# Patient Record
Sex: Male | Born: 1970 | Race: Black or African American | Hispanic: No | Marital: Married | Smoking: Former smoker
Health system: Southern US, Community
[De-identification: ages and names within clinical notes are randomized; demographics above are authoritative.]

## PROBLEM LIST (undated history)

## (undated) DIAGNOSIS — C6290 Malignant neoplasm of unspecified testis, unspecified whether descended or undescended: Secondary | ICD-10-CM

## (undated) DIAGNOSIS — F1910 Other psychoactive substance abuse, uncomplicated: Secondary | ICD-10-CM

## (undated) DIAGNOSIS — I82401 Acute embolism and thrombosis of unspecified deep veins of right lower extremity: Secondary | ICD-10-CM

## (undated) DIAGNOSIS — F419 Anxiety disorder, unspecified: Secondary | ICD-10-CM

## (undated) DIAGNOSIS — T7840XA Allergy, unspecified, initial encounter: Secondary | ICD-10-CM

## (undated) HISTORY — DX: Other psychoactive substance abuse, uncomplicated: F19.10

## (undated) HISTORY — PX: ORCHIECTOMY: SHX2116

## (undated) HISTORY — DX: Anxiety disorder, unspecified: F41.9

## (undated) HISTORY — DX: Malignant neoplasm of unspecified testis, unspecified whether descended or undescended: C62.90

## (undated) HISTORY — DX: Acute embolism and thrombosis of unspecified deep veins of right lower extremity: I82.401

## (undated) HISTORY — DX: Allergy, unspecified, initial encounter: T78.40XA

## (undated) HISTORY — PX: TESTICLE REMOVAL: SHX68

## (undated) HISTORY — PX: COLONOSCOPY: SHX174

## (undated) HISTORY — PX: OTHER SURGICAL HISTORY: SHX169

---

## 1998-03-09 ENCOUNTER — Encounter: Admission: RE | Admit: 1998-03-09 | Discharge: 1998-03-09 | Payer: Self-pay | Admitting: *Deleted

## 1999-06-18 ENCOUNTER — Encounter: Payer: Self-pay | Admitting: Emergency Medicine

## 1999-06-18 ENCOUNTER — Emergency Department (HOSPITAL_COMMUNITY): Admission: EM | Admit: 1999-06-18 | Discharge: 1999-06-18 | Payer: Self-pay | Admitting: Emergency Medicine

## 2002-10-25 ENCOUNTER — Encounter: Payer: Self-pay | Admitting: Emergency Medicine

## 2002-10-25 ENCOUNTER — Emergency Department (HOSPITAL_COMMUNITY): Admission: EM | Admit: 2002-10-25 | Discharge: 2002-10-25 | Payer: Self-pay | Admitting: Emergency Medicine

## 2010-01-09 ENCOUNTER — Encounter (INDEPENDENT_AMBULATORY_CARE_PROVIDER_SITE_OTHER): Payer: Self-pay | Admitting: *Deleted

## 2010-01-12 ENCOUNTER — Ambulatory Visit: Payer: Self-pay | Admitting: Gastroenterology

## 2010-01-12 DIAGNOSIS — K625 Hemorrhage of anus and rectum: Secondary | ICD-10-CM | POA: Insufficient documentation

## 2010-01-12 DIAGNOSIS — F1210 Cannabis abuse, uncomplicated: Secondary | ICD-10-CM | POA: Insufficient documentation

## 2010-01-12 DIAGNOSIS — F1920 Other psychoactive substance dependence, uncomplicated: Secondary | ICD-10-CM

## 2010-01-12 DIAGNOSIS — K589 Irritable bowel syndrome without diarrhea: Secondary | ICD-10-CM | POA: Insufficient documentation

## 2010-01-12 DIAGNOSIS — R1012 Left upper quadrant pain: Secondary | ICD-10-CM | POA: Insufficient documentation

## 2010-01-12 LAB — CONVERTED CEMR LAB
ALT: 36 units/L (ref 0–53)
AST: 29 units/L (ref 0–37)
Albumin: 4.3 g/dL (ref 3.5–5.2)
Alkaline Phosphatase: 56 units/L (ref 39–117)
Amylase: 66 units/L (ref 27–131)
BUN: 14 mg/dL (ref 6–23)
Basophils Absolute: 0 10*3/uL (ref 0.0–0.1)
Basophils Relative: 0.4 % (ref 0.0–3.0)
Bilirubin, Direct: 0.1 mg/dL (ref 0.0–0.3)
CO2: 28 meq/L (ref 19–32)
Calcium: 10 mg/dL (ref 8.4–10.5)
Chloride: 103 meq/L (ref 96–112)
Creatinine, Ser: 1.3 mg/dL (ref 0.4–1.5)
Eosinophils Absolute: 0.1 10*3/uL (ref 0.0–0.7)
Eosinophils Relative: 1.7 % (ref 0.0–5.0)
Ferritin: 242.3 ng/mL (ref 22.0–322.0)
Folate: 7.7 ng/mL
GFR calc non Af Amer: 81.16 mL/min (ref >60)
Glucose, Bld: 82 mg/dL (ref 70–99)
HCT: 47 % (ref 39.0–52.0)
Hemoglobin: 16.1 g/dL (ref 13.0–17.0)
Iron: 99 ug/dL (ref 42–165)
Lipase: 23 units/L (ref 11.0–59.0)
Lymphocytes Relative: 20.5 % (ref 12.0–46.0)
Lymphs Abs: 1.4 10*3/uL (ref 0.7–4.0)
MCHC: 34.3 g/dL (ref 30.0–36.0)
MCV: 85.7 fL (ref 78.0–100.0)
Monocytes Absolute: 1.3 10*3/uL — ABNORMAL HIGH (ref 0.1–1.0)
Monocytes Relative: 18.9 % — ABNORMAL HIGH (ref 3.0–12.0)
Neutro Abs: 4 10*3/uL (ref 1.4–7.7)
Neutrophils Relative %: 58.5 % (ref 43.0–77.0)
Platelets: 233 10*3/uL (ref 150.0–400.0)
Potassium: 3.9 meq/L (ref 3.5–5.1)
RBC: 5.48 M/uL (ref 4.22–5.81)
RDW: 13.2 % (ref 11.5–14.6)
Saturation Ratios: 28.4 % (ref 20.0–50.0)
Sodium: 138 meq/L (ref 135–145)
TSH: 2.25 microintl units/mL (ref 0.35–5.50)
Total Bilirubin: 0.8 mg/dL (ref 0.3–1.2)
Total Protein: 8.1 g/dL (ref 6.0–8.3)
Transferrin: 248.9 mg/dL (ref 212.0–360.0)
Vitamin B-12: 441 pg/mL (ref 211–911)
WBC: 6.8 10*3/uL (ref 4.5–10.5)

## 2010-01-16 ENCOUNTER — Ambulatory Visit (HOSPITAL_COMMUNITY): Admission: RE | Admit: 2010-01-16 | Discharge: 2010-01-16 | Payer: Self-pay | Admitting: Gastroenterology

## 2010-01-17 ENCOUNTER — Ambulatory Visit: Payer: Self-pay | Admitting: Gastroenterology

## 2010-01-17 LAB — CONVERTED CEMR LAB: UREASE: NEGATIVE

## 2010-01-18 ENCOUNTER — Telehealth: Payer: Self-pay | Admitting: Gastroenterology

## 2010-01-18 ENCOUNTER — Encounter: Payer: Self-pay | Admitting: Gastroenterology

## 2010-01-19 ENCOUNTER — Encounter: Payer: Self-pay | Admitting: Gastroenterology

## 2010-01-22 ENCOUNTER — Telehealth: Payer: Self-pay | Admitting: Gastroenterology

## 2010-01-24 ENCOUNTER — Telehealth: Payer: Self-pay | Admitting: Gastroenterology

## 2010-02-02 ENCOUNTER — Ambulatory Visit: Payer: Self-pay | Admitting: Gastroenterology

## 2010-02-02 ENCOUNTER — Encounter (INDEPENDENT_AMBULATORY_CARE_PROVIDER_SITE_OTHER): Payer: Self-pay | Admitting: *Deleted

## 2010-02-02 DIAGNOSIS — M109 Gout, unspecified: Secondary | ICD-10-CM | POA: Insufficient documentation

## 2010-02-02 LAB — CONVERTED CEMR LAB: Sed Rate: 12 mm/hr (ref 0–22)

## 2010-02-10 ENCOUNTER — Emergency Department (HOSPITAL_COMMUNITY): Admission: EM | Admit: 2010-02-10 | Discharge: 2010-02-10 | Payer: Self-pay | Admitting: Emergency Medicine

## 2010-03-01 ENCOUNTER — Telehealth: Payer: Self-pay | Admitting: Gastroenterology

## 2010-03-02 ENCOUNTER — Ambulatory Visit: Payer: Self-pay | Admitting: Gastroenterology

## 2010-03-06 ENCOUNTER — Encounter: Payer: Self-pay | Admitting: Gastroenterology

## 2010-03-09 ENCOUNTER — Encounter: Payer: Self-pay | Admitting: Gastroenterology

## 2010-03-09 ENCOUNTER — Inpatient Hospital Stay (HOSPITAL_COMMUNITY): Admission: EM | Admit: 2010-03-09 | Discharge: 2010-03-17 | Payer: Self-pay | Admitting: Cardiovascular Disease

## 2010-03-13 ENCOUNTER — Ambulatory Visit: Payer: Self-pay | Admitting: Vascular Surgery

## 2010-03-13 ENCOUNTER — Ambulatory Visit: Payer: Self-pay | Admitting: Oncology

## 2010-03-16 ENCOUNTER — Ambulatory Visit: Payer: Self-pay | Admitting: Oncology

## 2010-04-05 DIAGNOSIS — K5289 Other specified noninfective gastroenteritis and colitis: Secondary | ICD-10-CM | POA: Insufficient documentation

## 2010-04-05 DIAGNOSIS — K449 Diaphragmatic hernia without obstruction or gangrene: Secondary | ICD-10-CM | POA: Insufficient documentation

## 2010-04-06 ENCOUNTER — Ambulatory Visit: Payer: Self-pay | Admitting: Gastroenterology

## 2010-04-19 ENCOUNTER — Ambulatory Visit: Payer: Self-pay | Admitting: Oncology

## 2010-04-24 ENCOUNTER — Encounter: Payer: Self-pay | Admitting: Gastroenterology

## 2010-04-25 LAB — LUPUS ANTICOAGULANT PANEL
DRVVT 1:1 Mix: 43.4 secs (ref 36.2–44.3)
DRVVT: 76.7 secs — ABNORMAL HIGH (ref 36.2–44.3)
Lupus Anticoagulant: NOT DETECTED
PTT Lupus Anticoagulant: 45.9 secs — ABNORMAL HIGH (ref 30.0–45.6)

## 2010-05-14 ENCOUNTER — Ambulatory Visit (HOSPITAL_COMMUNITY): Admission: RE | Admit: 2010-05-14 | Discharge: 2010-05-14 | Payer: Self-pay | Admitting: Urology

## 2010-05-22 ENCOUNTER — Ambulatory Visit: Payer: Self-pay | Admitting: Oncology

## 2010-05-24 ENCOUNTER — Encounter: Payer: Self-pay | Admitting: Gastroenterology

## 2010-06-04 ENCOUNTER — Ambulatory Visit
Admission: RE | Admit: 2010-06-04 | Discharge: 2010-06-12 | Payer: Self-pay | Source: Home / Self Care | Admitting: Radiation Oncology

## 2010-06-12 ENCOUNTER — Encounter: Payer: Self-pay | Admitting: Gastroenterology

## 2010-06-26 ENCOUNTER — Encounter: Payer: Self-pay | Admitting: Gastroenterology

## 2010-06-27 ENCOUNTER — Ambulatory Visit: Payer: Self-pay | Admitting: Oncology

## 2010-08-16 NOTE — Assessment & Plan Note (Signed)
Summary: 2 Wk follow up/ dfs   History of Present Illness Visit Type: Follow-up Visit Primary GI MD: Sheryn Bison MD FACP FAGA Primary Provider: Geraldo Pitter, MD  Requesting Provider: na Chief Complaint: Spasms and blood have decreased History of Present Illness:   His GI symptoms are much improved but he continued with some cramping, diarrhea, and intermittent rectal bleeding. He has had sudden swelling of his right ankle and right foot and was in urgent care and diagnosed as having gout and placed on colchicine 0.2 mg twice a day with little improvement. His upper gastrointestinal symptoms are markedly better on Carafate, PPI therapy, and anti-spasmodic.  He's had diffuse swelling and pain in his right ankle and foot in the last 48 hours. He denies other arthritis or musculoskeletal problems. Family history is remarkable for gouty arthritis. Patient is a heavy abuser of alcohol but allegedly has not used since his last endoscopic exam several weeks ago. This is confirmed by his girlfriend. He previously had colonoscopy by Dr. 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 pylori. Ultrasound was negative and multiple labs were normal including liver enzymes. CDT level was elevated at 1.6 consistent with heavy alcohol intake.   GI Review of Systems    Reports abdominal pain.      Denies acid reflux, belching, bloating, chest pain, dysphagia with liquids, dysphagia with solids, heartburn, loss of appetite, nausea, vomiting, vomiting blood, weight loss, and  weight gain.      Reports diarrhea, hemorrhoids, and  rectal bleeding.     Denies anal fissure, black tarry stools, change in bowel habit, constipation, diverticulosis, fecal incontinence, heme positive stool, irritable bowel syndrome, jaundice, light color stool, liver problems, and  rectal pain.    Current Medications (verified): 1)   Hydrocodone-Acetaminophen 7.5-750 Mg Tabs (Hydrocodone-Acetaminophen) .... 1/2 Tablet By Mouth As Needed For Pain 2)  [facility]vs Acid Reducer Max St 150 Mg Tabs (Ranitidine Hcl) .... As Needed 3)  Eq Chlortabs 4 Mg Tabs (Chlorpheniramine Maleate) .... As Needed 4)  Carafate 1 Gm Tabs (Sucralfate) .... Take 1 Tablet By Mouth Four Times A Day 1 Hour Before or After Food 5)  Dicyclomine Hcl 10 Mg Caps (Dicyclomine Hcl) .Marland Kitchen.. 1 By Mouth Q 6 Hrs As Needed Spasms 6)  Dexilant 60 Mg Cpdr (Dexlansoprazole) .Marland Kitchen.. 1 Once Daily  Allergies (verified): No Known Drug Allergies  Past History:  Past medical, surgical, family and social histories (including risk factors) reviewed for relevance to current acute and chronic problems.  Past Medical History: Reviewed history from 01/12/2010 and no changes required. Anxiety Disorder  Past Surgical History: Reviewed history from 01/12/2010 and no changes required. Unremarkable  Family History: Reviewed history from 01/12/2010 and no changes required. No FH of Colon Cancer: Family History of Breast Cancer:Mother  Family History of Prostate Cancer:Father   Social History: Reviewed history from 01/12/2010 and no changes required. Occupation: [employer] Married Childern  Alcohol Use - yes: 1-2 daily  Patient is a former smoker: Quit in Feb 2011 Illicit Drug Use - yes: Marijuana Daily Caffeine Use: 4 daily   Review of Systems       The patient complains of arthritis/joint pain and swelling of feet/legs.  The patient denies allergy/sinus, anemia, anxiety-new, back pain, blood in urine, breast changes/lumps, change in vision, confusion, cough, coughing up blood, depression-new, fainting, fatigue, fever, headaches-new, hearing problems, heart murmur, heart rhythm changes, itching, menstrual pain, muscle pains/cramps, night  sweats, nosebleeds, pregnancy symptoms, shortness of breath, skin rash, sleeping problems, sore throat, swollen lymph glands, thirst - excessive ,  urination - excessive , urination changes/pain, urine leakage, vision changes, and voice change.    Vital Signs:  Patient profile:   40 year old male Height:      69 inches Weight:      158.38 pounds BMI:     23.47 Pulse rate:   60 / minute Pulse rhythm:   regular BP sitting:   120 / 68  (left arm) Cuff size:   regular  Vitals Entered By: June McMurray CMA Duncan Dull) (February 02, 2010 11:49 AM)  Physical Exam  General:  Well developed, well nourished, no acute distress.healthy appearing.   Head:  Normocephalic and atraumatic. Eyes:  PERRLA, no icterus.exam deferred to patient's ophthalmologist.   Msk:  joint tenderness, joint redness, joint warmth, and arthritic changes.  right foot and ankle swollen and tender without obvious external lesions. Other joints are unremarkable. Psych:  Alert and cooperative. Normal mood and affect.   Impression & Recommendations:  Problem # 1:  IBS (ICD-564.1) Assessment Improved I am concerned about his continued diarrhea and intermittent rectal bleeding and his recent arthritis. I have referred him to orthopedics for examination, we'll order serum uric acid level, sedimentation rate, and plan colonoscopy. He is to continue to avoid alcohol and to use daily PPIs, dicyclomine and p.r.n. Carafate for his gastritis.  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  Other Orders: TLB-Uric Acid, Blood (84550-URIC) TLB-Sedimentation Rate (ESR) (85652-ESR)  Patient Instructions: 1)  Please go to the basement for lab work. 2)  You are scheduled for a colonoscopy. 3)  You are referred to the orthepedist. 4)  The medication list was reviewed and reconciled.  All changed / newly prescribed medications were explained.  A complete medication list was provided to the patient / caregiver. 5)  Copy sent to :  Dr.Veita Bland 6)  Please continue current medications.  7)  Colonoscopy and Flexible Sigmoidoscopy brochure given.  8)  Conscious Sedation brochure given.  9)  Continued strict avoidance of illicit drugs and alcohol. 10)  Advised to stick with a low residue diet  avoiding food that can irritate bowel (see handout).   Appended Document: 2 Wk follow up/ dfs    Clinical Lists Changes  Medications: Added new medication of MOVIPREP 100 GM  SOLR (PEG-KCL-NACL-NASULF-NA ASC-C) As per prep instructions. - Signed Rx of MOVIPREP 100 GM  SOLR (PEG-KCL-NACL-NASULF-NA ASC-C) As per prep instructions.;  #1 x 0;  Signed;  Entered by: Ashok Cordia RN;  Authorized by: Mardella Layman MD [facility];  Method used: [facility] [address] [phone]*, [address], [place], [place], [place]  [address], Ph: [phone] or [phone], Fax: [phone] Orders: Added new Test order of Colonoscopy (Colon) - Signed Added new Referral order of Orthopedic Referral (Ortho) - Signed    Prescriptions: MOVIPREP 100 GM  SOLR (PEG-KCL-NACL-NASULF-NA ASC-C) As per prep instructions.  #1 x 0   Entered by:   Ashok Cordia RN   Authorized by:   Mardella Layman MD [facility]   Signed by:   Ashok Cordia RN on 02/02/2010   Method used:   [facility]        [facility]  [address] [phone]* (retail)       [address]  [address]       [place], [place]  [address]       Ph: [phone] or [phone]       Fax: [phone]   RxID:   [id number]

## 2010-08-16 NOTE — Letter (Signed)
Summary: [facility]  [facility]   Imported By: Sherian Rein 06/18/2010 10:17:50  _____________________________________________________________________  External Attachment:    Type:   Image     Comment:   External Document  Appended Document: [facility] I HAVE OK'ED RADIATION AND TALKED WITH DR. Dayton Scrape...

## 2010-08-16 NOTE — Letter (Signed)
Summary: [facility]  [facility]   Imported By: Sherian Rein 05/14/2010 09:21:52  _____________________________________________________________________  External Attachment:    Type:   Image     Comment:   External Document

## 2010-08-16 NOTE — Assessment & Plan Note (Signed)
Summary: One month follow up   History of Present Illness Visit Type: follow up Primary GI MD: Sheryn Bison MD FACP FAGA Primary Truda Staub: Geraldo Pitter, MD  Requesting Tita Terhaar: na Chief Complaint: one month follow up of rectal bleeding. Patient states that he is doing much better. History of Present Illness:   this patient is a 40 year old African American male who has chronic recurrent ulcerative colitis. He recent had some rectal bleeding and diarrhea, underwent colonoscopy with mucosal biopsies confirming mild inflammatory bowel disease. 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 As a Result, he has not started aminosalicylate therapy. However, he denies any current GI issues. He also has an element of irritable bowel syndrome.   GI Review of Systems      Denies abdominal pain, acid reflux, belching, bloating, chest pain, dysphagia with liquids, dysphagia with solids, heartburn, loss of appetite, nausea, vomiting, vomiting blood, weight loss, and  weight gain.        Denies anal fissure, black tarry stools, change in bowel habit, constipation, diarrhea, diverticulosis, fecal incontinence, heme positive stool, hemorrhoids, irritable bowel syndrome, jaundice, light color stool, liver problems, rectal bleeding, and  rectal pain.    Current Medications (verified): 1)  Hydrocodone-Acetaminophen 7.5-750 Mg Tabs (Hydrocodone-Acetaminophen) .... 1/2 Tablet By Mouth As Needed For Pain 2)  [facility] Acid Reducer Max St 150 Mg Tabs (Ranitidine Hcl) .... As Needed 3)  Eq Chlortabs 4 Mg Tabs (Chlorpheniramine Maleate) .... As Needed 4)  Coumadin 5 Mg Tabs (Warfarin Sodium) .... One Tablet By Mouth Once Daily 5)  Hydrocodone-Acetaminophen 5-325 Mg Tabs (Hydrocodone-Acetaminophen) .... One Tablet By Mouth As Needed  Allergies (verified): No Known Drug Allergies  Past History:  Past  medical, surgical, family and social histories (including risk factors) reviewed for relevance to current acute and chronic problems.  Past Medical History: Reviewed history from 04/06/2010 and no changes required. 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 Unremarkable  Family History: Reviewed history from 01/12/2010 and no changes required. No FH of Colon Cancer: Family History of Breast Cancer:Mother  Family History of Prostate Cancer:Father   Social History: Reviewed history from 01/12/2010 and no changes required. Occupation: [employer] Married Childern  Alcohol Use - yes: 1-2 daily  Patient is a former smoker: Quit in Feb 2011 Illicit Drug Use - yes: Marijuana Daily Caffeine Use: 4 daily   Review of Systems  The patient denies allergy/sinus, anemia, anxiety-new, arthritis/joint pain, back pain, blood in urine, breast changes/lumps, change in vision, confusion, cough, coughing up blood, depression-new, fainting, fatigue, fever, headaches-new, hearing problems, heart murmur, heart rhythm changes, itching, menstrual pain, muscle pains/cramps, night sweats, nosebleeds, pregnancy symptoms, shortness of breath, skin rash, sleeping problems, sore throat, swelling of feet/legs, swollen lymph glands, thirst - excessive , urination - excessive , urination changes/pain, urine leakage, vision changes, and voice change.    Vital Signs:  Patient profile:   40 year old male Height:      69 inches Weight:      170.13 pounds BMI:     25.21 Pulse rate:   78 / minute Pulse rhythm:   regular BP sitting:   124 / 74  (left arm) Cuff size:  regular  Vitals Entered By: Harlow Mares CMA Duncan Dull) (April 06, 2010 11:27 AM)  Physical Exam  General:  Well developed, well nourished, no acute distress.healthy appearing.   Head:  Normocephalic and atraumatic. Eyes:  PERRLA, no icterus.exam deferred to patient's ophthalmologist.   Psych:  Alert and cooperative. Normal mood and affect.   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 He is to continue other medications listed and reviewed in his record today. He has primary care followup with Dr. Parke Simmers.  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 over-the-counter H2 blockers.  Problem # 3:  IBS (ICD-564.1) Assessment: Improved p.r.n. anti-spasmodic as needed.  Problem # 4:  RECTAL BLEEDING (ICD-569.3) Assessment: Improved  Patient Instructions: 1)  Copy sent to : Geraldo Pitter, MD  2)  Your prescription has been sent to you pharmacy.  3)  Lialda samples given today.  4)  Please schedule a follow-up appointment in 3 months. 5)  The medication list was reviewed and reconciled.  All changed / newly prescribed medications were explained.  A complete medication list was provided to the patient / caregiver. 6)  IBS brochure given.  7)  Inflammatory Bowel Disease brochure given.  Prescriptions: LIALDA 1.2 GM TBEC (MESALAMINE) take two by mouth once daily  #60 x 1   Entered by:   Harlow Mares CMA (AAMA)   Authorized by:   Mardella Layman MD [facility]   Signed by:   Harlow Mares CMA (AAMA) on 04/06/2010   Method used:   Electronically to        [facility] [address] (retail)       [address]       [place], [place]  [address]       Ph: [phone]       Fax: [phone]   RxID:   [id number]   Appended Document: One month follow up copy Dr. Mancel Bale in oncology  Appended Document: One month  follow up biscomed

## 2010-08-16 NOTE — Progress Notes (Signed)
Summary: spasms  Phone Note Call from Patient Call back at Home Phone [phone]   Caller: wife Call For: Dr. Jarold Motto Reason for Call: Talk to Nurse Summary of Call: having stomach spasms since EGD last week Initial call taken by: Vallarie Mare,  January 24, 2010 12:56 PM  Follow-up for Phone Call        Talked with pt.  States he is having crampy pain, spasms in abd.  Worse before he has a BM.  Other symptoms are better.  Asking if there is something he can take for spasms? Follow-up by: Ashok Cordia RN,  January 24, 2010 2:06 PM    Additional Follow-up for Phone Call Additional follow up Details #2::    BENTYL 10 MG Q6H as needed... Follow-up by: Mardella Layman MD Clementeen Graham,  January 24, 2010 2:42 PM   Appended Document: spasms Pt's wife notified.  Req a few more samples of dexialnt.   Clinical Lists Changes  Medications: Added new medication of DICYCLOMINE HCL 10 MG CAPS (DICYCLOMINE HCL) 1 by mouth q 6 hrs as needed spasms - Signed Added new medication of DEXILANT 60 MG CPDR (DEXLANSOPRAZOLE) 1 once daily Rx of DICYCLOMINE HCL 10 MG CAPS (DICYCLOMINE HCL) 1 by mouth q 6 hrs as needed spasms;  #50 x 3;  Signed;  Entered by: Ashok Cordia RN;  Authorized by: Mardella Layman MD [facility];  Method used: [facility] [address] [phone]*, [address], [place], [place], [place]  [id number], Ph: [phone] or [phone], Fax: [phone]    Prescriptions: DICYCLOMINE HCL 10 MG CAPS (DICYCLOMINE HCL) 1 by mouth q 6 hrs as needed spasms  #50 x 3   Entered by:   Ashok Cordia RN   Authorized by:   Mardella Layman MD [facility]   Signed by:   Ashok Cordia RN on 01/24/2010   Method used:   Electronically to        [facility]  [address] [phone]* (retail)       [address]       [place], [place]  [id number]       Ph: [phone] or [phone]       Fax: [phone]   RxID:   [id number]

## 2010-08-16 NOTE — Letter (Signed)
Summary: EGD Instructions  [facility]  [address] [place], [place] [address]   Phone: [phone]  Fax: [phone]       Dustin Chan    06-04-1971    MRN: [id number]       Procedure Day Dorna Bloom: Wednesday July 6th, 2011     Arrival Time: 2:30pm     Procedure Time:3:30pm     Location of Procedure:                    _  x [facility] (4th Floor)    PREPARATION FOR ENDOSCOPY   On 01/17/10 DAY OF THE PROCEDURE:  1.   No solid foods, milk or milk products are allowed after midnight the night before your procedure.  2.   Do not drink anything colored red or purple.  Avoid juices with pulp.  No orange juice.  3.  You may drink clear liquids until1:30pm, which is 2 hours before your procedure.                                                                                                CLEAR LIQUIDS INCLUDE: Water Jello Ice Popsicles Tea (sugar ok, no milk/cream) Powdered fruit flavored drinks Coffee (sugar ok, no milk/cream) Gatorade Juice: apple, white grape, white cranberry  Lemonade Clear bullion, consomm, broth Carbonated beverages (any kind) Strained chicken noodle soup Hard Candy   MEDICATION INSTRUCTIONS  Unless otherwise instructed, you should take regular prescription medications with a small sip of water as early as possible the morning of your procedure.         OTHER INSTRUCTIONS  You will need a responsible adult at least 38 years of age to accompany you and drive you home.   This person must remain in the waiting room during your procedure.  Wear loose fitting clothing that is easily removed.  Leave jewelry and other valuables at home.  However, you may wish to bring a book to read or an iPod/MP3 player to listen to music as you wait for your procedure to start.  Remove all body piercing jewelry and leave at home.  Total time from sign-in until discharge is approximately 2-3 hours.  You should go home directly  after your procedure and rest.  You can resume normal activities the day after your procedure.  The day of your procedure you should not:   Drive   Make legal decisions   Operate machinery   Drink alcohol   Return to work  You will receive specific instructions about eating, activities and medications before you leave.    The above instructions have been reviewed and explained to me by   Marchelle Folks.     I fully understand and can verbalize these instructions _____________________________ Date _________

## 2010-08-16 NOTE — Letter (Signed)
Summary: Patient Notice- Colon Biospy Results please delete  [facility]  [address] [place], [place] [address]   Phone: [phone]  Fax: [phone]        March 09, 2010 MRN: [id number]    Dustin Chan [address] [place], [place]  [address]    Dear Dustin Chan,  I am pleased to inform you that the biopsies taken during your recent colonoscopy did not show any evidence of cancer upon pathologic examination.  Additional information/recommendations:  __No further action is needed at this time.  Please follow-up with      your primary care physician for your other healthcare needs.  __Please call [phone] to schedule a return visit to review      your condition.  x_Continue with the treatment plan as outlined on the day of your      exam.My nurse will call you with a new prescription for your mild colitis.  __You should have a repeat colonoscopy examination for this problem           in _ years.  Please call us if you are having persistent problems or have questions about your condition that have not been fully answered at this time.  Sincerely,  Mardella Layman MD [facility]   This letter has been electronically signed by your physician.

## 2010-08-16 NOTE — Medication Information (Signed)
Summary: Approved [facility] [employer] [facility]  Approved [facility] [employer] [facility]   Imported By: Lennie Odor 01/25/2010 09:29:23  _____________________________________________________________________  External Attachment:    Type:   Image     Comment:   External Document

## 2010-08-16 NOTE — Miscellaneous (Signed)
Summary: Orders Update  Clinical Lists Changes  Orders: Added new Test order of TLB-H Pylori Screen Gastric Biopsy (83013-CLOTEST) - Signed 

## 2010-08-16 NOTE — Letter (Signed)
Summary: New Patient letter  [facility]  [address] [place], [place] [address]   Phone: [phone]  Fax: [phone]       01/09/2010 MRN: [id number]  Dustin Chan [address] [place], [place]  [address]  Dear Mr. THONE,  Welcome to the Gastroenterology Division at [facility].    You are scheduled to see Dr.  Sheryn Bison on January 12, 2010 at 9:30am on the 3rd floor at [facility], [address]  We ask that you try to arrive at our office 15 minutes prior to your appointment time to allow for check-in.  We would like you to complete the enclosed self-administered evaluation form prior to your visit and bring it with you on the day of your appointment.  We will review it with you.  Also, please bring a complete list of all your medications or, if you prefer, bring the medication bottles and we will list them.  Please bring your insurance card so that we may make a copy of it.  If your insurance requires a referral to see a specialist, please bring your referral form from your primary care physician.  Co-payments are due at the time of your visit and may be paid by cash, check or credit card.     Your office visit will consist of a consult with your physician (includes a physical exam), any laboratory testing he/she may order, scheduling of any necessary diagnostic testing (e.g. x-ray, ultrasound, CT-scan), and scheduling of a procedure (e.g. Endoscopy, Colonoscopy) if required.  Please allow enough time on your schedule to allow for any/all of these possibilities.    If you cannot keep your appointment, please call [phone] to cancel or reschedule prior to your appointment date.  This allows [place] the opportunity to schedule an appointment for another patient in need of care.  If you do not cancel or reschedule by 5 p.m. the business day prior to your appointment date, you will be charged a $50.00 late cancellation/no-show fee.    Thank  you for choosing [facility] for your medical needs.  We appreciate the opportunity to care for you.  Please visit [place] at our website  to learn more about our practice.                     Sincerely,                                                             The Gastroenterology Division

## 2010-08-16 NOTE — Letter (Signed)
Summary: Patient Notice- Colon Biospy Results Please Review  [facility]  [address] [place], [place] [address]   Phone: [phone]  Fax: [phone]        March 06, 2010 MRN: [id number]    Dustin Chan [address] [place], [place]  [address]    Dear Mr. BROADWELL,  I am pleased to inform you that the biopsies taken during your recent colonoscopy did not show any evidence of cancer upon pathologic examination.  Additional information/recommendations:  __No further action is needed at this time.  Please follow-up with      your primary care physician for your other healthcare needs.  __Please call [phone] to schedule a return visit to review      your condition.  xContinue with the treatment plan as outlined on the day of your      exam.My nurse will call you to start a new medication. Biopsies of the colon suggest left-sided colitis.  __You should have a repeat colonoscopy examination for this problem           in _ years.  Please call us if you are having persistent problems or have questions about your condition that have not been fully answered at this time.  Sincerely,  Mardella Layman MD [facility]   This letter has been electronically signed by your physician.   Appended Document: Patient Notice- Colon Biospy Results Please Review letter mailed

## 2010-08-16 NOTE — Procedures (Signed)
Summary: Colonoscopy  Patient: Dustin Chan Note: All result statuses are Final unless otherwise noted.  Tests: (1) Colonoscopy (COL)   COL Colonoscopy           DONE     [facility]     [address]     [place], [place]  [address]           COLONOSCOPY PROCEDURE REPORT           PATIENT:  Dustin Chan, Dustin Chan  MR#:  [id number]     BIRTHDATE:  [birth year], 39 yrs. old  GENDER:  male     ENDOSCOPIST:  Vania Rea. Jarold Motto, MD, [facility] [place]     REF. BY:     PROCEDURE DATE:  03/02/2010     PROCEDURE:  Colonoscopy with biopsy     ASA CLASS:  Class II     INDICATIONS:  rectal bleeding, Abdominal pain     MEDICATIONS:   Fentanyl 75 mcg IV, Versed 7 mg IV           DESCRIPTION OF PROCEDURE:   After the risks benefits and     alternatives of the procedure were thoroughly explained, informed     consent was obtained.  Digital rectal exam was performed and     revealed no abnormalities.   The Pentax Colonoscope [id number]     endoscope was introduced through the anus and advanced to the     cecum, which was identified by both the appendix and ileocecal     valve, without limitations.  The quality of the prep was adequate,     using MoviPrep.  The instrument was then slowly withdrawn as the     colon was fully examined.     <<PROCEDUREIMAGES>>           FINDINGS:  The mucosa was erythematus in the rectum and sigmoid     colon. GRANULAR MUCOSA BIOPSIED.R/O COLITIS VS PREP CHANGE.  This     was otherwise a normal examination of the colon.   Retroflexed     views in the rectum revealed no abnormalities.    The scope was     then withdrawn from the patient and the procedure completed.           COMPLICATIONS:  None     ENDOSCOPIC IMPRESSION:     1) Erythema in the rectum and sigmoid colon     2) Otherwise normal examination     RECOMMENDATIONS:     1) high fiber diet     2) Await biopsy results     REPEAT EXAM:  No           ______________________________     Vania Rea. Jarold Motto, MD,  Clementeen Graham           CC:  Renaye Rakers, MD           n.     Rosalie DoctorMarland Kitchen   Vania Rea. Patterson at 03/02/2010 03:12 PM           Baldemar Lenis, [id number]  Note: An exclamation mark (!) indicates a result that was not dispersed into the flowsheet. Document Creation Date: 03/02/2010 3:13 PM _______________________________________________________________________  (1) Order result status: Final Collection or observation date-time: 03/02/2010 15:06 Requested date-time:  Receipt date-time:  Reported date-time:  Referring Physician:   Ordering Physician: Sheryn Bison [phone]) Specimen Source:  Source: Launa Grill Order Number: [id number] Lab site:

## 2010-08-16 NOTE — Assessment & Plan Note (Signed)
Summary: bloody stools, abd pain...em   History of Present Illness Visit Type: new patient  Primary GI MD: Sheryn Bison MD FACP FAGA Primary Provider: Geraldo Pitter, MD  Requesting Provider: na Chief Complaint: Upper abd pain, loss of appetite, and BRB in stool   History of Present Illness:   40 year old African American male with a long history of chronic anxiety syndrome who apparently had GI evaluation by Dr. Corinda Gubler in 1997 and was allegedly told that he had a nervous stomach. He's done fairly well since that time but does use frequent alcohol and marijuana. He allegedly has stopped smoking since February has recently tried to discontinue alcohol because of associated epigastric and left upper quadrant abdominal pain with some nausea but no emesis. Patient has a lot of abdominal gas, bloating, frequent diarrhea, and also rectal bleeding. He denies abuse of NSAIDs. He had been on over-the-counter ranitidine with mild improvement. He has hydrocodone-acetaminophen 7.5-750 mg and takes a half a tablet one to 2 times a day on a chronic basis. He has had no specific hepatobiliary complaints, fever, chills, anorexia or weight loss.  Chart review shows a negative CT scan of his abdomen in April of 2004 separate small right inguinal hernia. He gives no history of prior hepatitis or pancreatitis. He denies neurologic, cardiovascular pulmonary problems.   GI Review of Systems    Reports abdominal pain and  loss of appetite.     Location of  Abdominal pain: upper abdomen.    Denies acid reflux, belching, bloating, chest pain, dysphagia with liquids, dysphagia with solids, heartburn, nausea, vomiting, vomiting blood, weight loss, and  weight gain.      Reports hemorrhoids, rectal bleeding, and  rectal pain.     Denies anal fissure, black tarry stools, change in bowel habit, constipation, diarrhea, diverticulosis, fecal incontinence, heme positive stool, irritable bowel syndrome, jaundice, light color  stool, and  liver problems.    Current Medications (verified): 1)  Hydrocodone-Acetaminophen 7.5-750 Mg Tabs (Hydrocodone-Acetaminophen) .... 1/2 Tablet By Mouth As Needed For Pain 2)  [facility]vs Acid Reducer Max St 150 Mg Tabs (Ranitidine Hcl) .... As Needed 3)  Eq Chlortabs 4 Mg Tabs (Chlorpheniramine Maleate) .... As Needed  Allergies (verified): No Known Drug Allergies  Past History:  Past medical, surgical, family and social histories (including risk factors) reviewed for relevance to current acute and chronic problems.  Past Medical History: Anxiety Disorder  Past Surgical History: Unremarkable  Family History: Reviewed history and no changes required. No FH of Colon Cancer: Family History of Breast Cancer:Mother  Family History of Prostate Cancer:Father   Social History: Reviewed history and no changes required. Occupation: [employer] Married Childern  Alcohol Use - yes: 1-2 daily  Patient is a former smoker: Quit in Feb 2011 Illicit Drug Use - yes: Marijuana Daily Caffeine Use: 4 daily  Smoking Status:  quit Drug Use:  yes  Review of Systems       The patient complains of anxiety-new, arthritis/joint pain, and back pain.  The patient denies allergy/sinus, anemia, blood in urine, breast changes/lumps, change in vision, confusion, cough, coughing up blood, depression-new, fainting, fatigue, fever, headaches-new, hearing problems, heart murmur, heart rhythm changes, itching, muscle pains/cramps, night sweats, nosebleeds, shortness of breath, skin rash, sleeping problems, sore throat, swelling of feet/legs, swollen lymph glands, thirst - excessive, urination - excessive, urination changes/pain, urine leakage, vision changes, and voice change.    Vital Signs:  Patient profile:   40 year old male Height:  69 inches Weight:      166 pounds BMI:     24.60 BSA:     1.91 Pulse rate:   88 / minute Pulse rhythm:   regular BP sitting:   122 / 64  (left arm) Cuff size:    regular  Vitals Entered By: Ok Anis CMA (January 12, 2010 9:33 AM)  Physical Exam  General:  Well developed, well nourished, no acute distress.healthy appearing.   Head:  Normocephalic and atraumatic. Eyes:  PERRLA, no icterus.exam deferred to patient's ophthalmologist.   Neck:  Supple; no masses or thyromegaly. Lungs:  Clear throughout to auscultation. Heart:  Regular rate and rhythm; no murmurs, rubs,  or bruits. Abdomen:  Soft, nontender and nondistended. No masses, hepatosplenomegaly or hernias noted. Normal bowel sounds. Rectal:  Normal exam.hemoccult positive.   Prostate:  .normal size prostate.   Msk:  Symmetrical with no gross deformities. Normal posture. Pulses:  Normal pulses noted. Extremities:  No clubbing, cyanosis, edema or deformities noted. Neurologic:  Alert and  oriented x4;  grossly normal neurologically. Skin:  Intact without significant lesions or rashes. Cervical Nodes:  No significant cervical adenopathy. Psych:  Alert and cooperative. Normal mood and affect.   Impression & Recommendations:  Problem # 1:  ABDOMINAL PAIN, LEFT UPPER QUADRANT (ICD-789.02) Assessment Deteriorated Rule Out cholelithiasis with associated pancreatitis, ethanol induced pancreatitis, versus possible peptic ulcer disease with penetration and guaiac positive stools. Labs are been ordered as well as upper normal ultrasound and endoscopy. We placed him on twice a day PPI with p.r.n. liquid Carafate, p.r.n. tramadol 50 mg, and aspirin avoid alcohol and NSAIDs. He may need followup colonoscopy exam opinion on his workup and clinical course.if possible, CDT level will be ordered. Orders: TLB-BMP (Basic Metabolic Panel-BMET) (80048-METABOL) TLB-CBC Platelet - w/Differential (85025-CBCD) TLB-TSH (Thyroid Stimulating Hormone) (84443-TSH) TLB-Hepatic/Liver Function Pnl (80076-HEPATIC) TLB-B12, Serum-Total ONLY (60454-U98) TLB-Ferritin (82728-FER) TLB-Folic Acid (Folate) (82746-FOL) TLB-Iron,  (Fe) Total (83540-FE) TLB-IBC Pnl (Iron/FE;Transferrin) (83550-IBC) TLB-Amylase (82150-AMYL) TLB-Lipase (83690-LIPASE) EGD (EGD) Ultrasound Abdomen (UAS)  Problem # 2:  RECTAL BLEEDING (ICD-569.3) Assessment: Unchanged Probable hemorrhoidal bleeding. Apparently as per had previous colonoscopy which was unremarkable. He really has minimal lower GI complaints at this time. 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 CDT level has been ordered.  Other Orders: T-CDT (carbohydrate deficient transferrin) (95621-30865)  Patient Instructions: 1)  Get labs drawn today in the basement. 2)  Pick up your prescriptions from your pharmacy.  3)  You have been scheduled for a abdominal ultrasound.  4)  Upper Endoscopy brochure given.  5)  Copy sent to : Geraldo Pitter, MD 6)  The medication list was reviewed and reconciled.  All changed / newly prescribed medications were explained.  A complete medication list was provided to the patient / caregiver. Prescriptions: TRAMADOL HCL 50 MG TABS (TRAMADOL HCL) one tablet by mouth every 6-8 hours as needed  #100 x 1   Entered by:   Christie Nottingham CMA (AAMA)   Authorized by:   Mardella Layman MD [facility]   Signed by:   Christie Nottingham CMA (AAMA) on 01/12/2010   Method used:   [facility]  [address] [phone]* (retail)       [address]       [place], [place]  [address]       Ph: [phone] or [phone]       Fax: [phone]   RxID:   [id number] CARAFATE 1 GM/10ML SUSP (SUCRALFATE) one tablespoon every 2-4 hours as needed  #1 pint x 1   Entered by:   Christie Nottingham CMA (AAMA)   Authorized by:   Mardella Layman MD [facility]   Signed by:   Christie Nottingham CMA (AAMA) on 01/12/2010   Method used:   [facility]        [facility]  [address] [phone]* (retail)       [address]       [place], [place]  [address]       Ph: [phone] or [phone]       Fax: [phone]   RxID:   [id number] ACIPHEX 20 MG TBEC (RABEPRAZOLE SODIUM) one tablet by mouth two times a day  #60 x 11   Entered by:   Christie Nottingham CMA (AAMA)   Authorized by:   Mardella Layman MD [facility]   Signed by:   Christie Nottingham CMA (AAMA) on 01/12/2010   Method used:   [facility]        [facility]  [address] [phone]* (retail)       [address]       [place], [place]  [address]       Ph: [phone] or [phone]       Fax: [phone]   RxID:   [id number]   Appended Document: bloody stools, abd pain...em pt called requesting to switch to carafate tabs as that would be more affordable.  Med switched to tablet form.   Prescriptions: CARAFATE 1 GM TABS (SUCRALFATE) Take 1 tablet by mouth four times a day 1 hour before or after food  #60 x 1   Entered by:   Francee Piccolo CMA (AAMA)   Authorized by:   Mardella Layman MD [facility]   Signed by:   Francee Piccolo CMA (AAMA) on 01/12/2010   Method used:   [facility]        [facility]  [address] [phone]* (retail)       [address]       [place], [place]  [address]       Ph: [phone] or [phone]       Fax: [phone]   RxID:   [id number]

## 2010-08-16 NOTE — Procedures (Signed)
Summary: Upper Endoscopy  Patient: Dustin Chan Note: All result statuses are Final unless otherwise noted.  Tests: (1) Upper Endoscopy (EGD)   EGD Upper Endoscopy       DONE     [facility]     [address]     [place], [place]  [address]           ENDOSCOPY PROCEDURE REPORT           PATIENT:  [name], [name]  MR#:  [id number]     BIRTHDATE:  08/16/1970, 39 yrs. old  GENDER:  male           ENDOSCOPIST:  Vania Rea. Jarold Motto, MD, [facility]     Referred by:           PROCEDURE DATE:  01/17/2010     PROCEDURE:  EGD with biopsy     ASA CLASS:  Class II     INDICATIONS:  abdominal pain HX OF ALCOHOL AND MARIJUANA     ABUSE.NEGATIVE W/U FOR PANCREATITIS.           MEDICATIONS:   Fentanyl 75 mcg IV, Versed 7 mg IV     TOPICAL ANESTHETIC:           DESCRIPTION OF PROCEDURE:   After the risks benefits and     alternatives of the procedure were thoroughly explained, informed     consent was obtained.  The LB [id number] endoscope was     introduced through the mouth and advanced to the second portion of     the duodenum, limited by retching and gagging.   The instrument     was slowly withdrawn as the mucosa was fully examined.     <<PROCEDUREIMAGES>>           Nodular mucosa was found. NODULAR BRUNNER'S GLANDS IN BULB.NO     ULCER.SI BX. DONE.  Mild gastritis was found in the body and the     antrum of the stomach. CLO AND REGULAR BIOPSIES DONE.  A hiatal     hernia was found. 5 CM HH NOTED AND FREE REFLUX.    Retroflexed     views revealed a hiatal hernia.    The scope was then withdrawn     from the patient and the procedure completed.           COMPLICATIONS:  None           ENDOSCOPIC IMPRESSION:     1) Nodular mucosa     2) Mild gastritis in the body and the antrum of the stomach     3) Hiatal hernia     1.R/O H.PYLORI     2.GERD     3.HYPERACIDITY.CONSIDER GASTRIN LEVEL.     RECOMMENDATIONS:     1) Await biopsy results     2) Rx CLO if positive  3) continue PPI     ALCOHOL AND ILLICT DRUG EXPOSURE RECOMMENDED.           REPEAT EXAM:  No           ______________________________     Vania Rea. Jarold Motto, MD, [facility]           CC:  Renaye Rakers, MD           n.     Rosalie DoctorMarland Kitchen   Vania Rea. Patterson at 01/17/2010 03:54 PM           Baldemar Lenis, [id number]  Note: An exclamation mark (!) indicates  a result that was not dispersed into the flowsheet. Document Creation Date: 01/17/2010 3:55 PM _______________________________________________________________________  (1) Order result status: Final Collection or observation date-time: 01/17/2010 15:40 Requested date-time:  Receipt date-time:  Reported date-time:  Referring Physician:   Ordering Physician: Sheryn Bison [phone]) Specimen Source:  Source: Launa Grill Order Number: [id number] Lab site:

## 2010-08-16 NOTE — Progress Notes (Signed)
Summary: results requested  Phone Note Call from Patient Call back at Home Phone [phone]   Caller: wife, Sheliah Hatch Call For: Dr. Jarold Motto Reason for Call: Lab or Test Results Summary of Call: would like EGD results and biopsy results... would also like to discuss gastritis treatment, a vaccine? Initial call taken by: Vallarie Mare,  January 22, 2010 10:44 AM  Follow-up for Phone Call        .Marland KitchenSpoke with pt's wife.  Pt has improved some.  Upper abd pain and nausea is a little better.  Pt cont's to have loose stools and some blood in stools.  Pt currently taking Dexilant (samples) and carafate. Wife informed H-pylori bx negative.  Asks what pt should do now?   Follow-up by: Ashok Cordia RN,  January 22, 2010 12:03 PM  Additional Follow-up for Phone Call Additional follow up Details #1::        SAME RX...NO ALCOHOL,,,OV 2 WEEKS Additional Follow-up by: Mardella Layman MD Clementeen Graham,  January 22, 2010 12:10 PM    Additional Follow-up for Phone Call Additional follow up Details #2::    Wife notified.  Appt sch.  WIfe req copy of [place] report be mailed to her.  Follow-up by: Ashok Cordia RN,  January 22, 2010 12:49 PM

## 2010-08-16 NOTE — Progress Notes (Signed)
Summary: prep ?  Phone Note Call from Patient Call back at [phone]   Caller: wife Call For: Dr. Jarold Motto Reason for Call: Talk to Nurse Summary of Call: would like a cheaper prep Initial call taken by: Vallarie Mare,  March 01, 2010 11:27 AM  Follow-up for Phone Call        Returned phone call and left message on a voice mail for them to call us back. Follow-up by: Jennye Boroughs RN,  March 01, 2010 12:42 PM  Additional Follow-up for Phone Call Additional follow up Details #1::        Pts. wife returned phone call.  Explained to her that Dr. Jarold Motto prefers the moviprep and there was no other alternative.  Told her that we would give them a card for a rebate tomorrow when they come for her husbands procedure and to make sure they save the receipt.   Additional Follow-up by: Jennye Boroughs RN,  March 01, 2010 2:25 PM

## 2010-08-16 NOTE — Letter (Signed)
Summary: [facility] Instructions  [facility]  [address] [place], [place] [address]   Phone: [phone]  Fax: [phone]       KAHLEB MCCLANE    October 15, 1970    MRN: [id number]        Procedure Day /Date: Friday, 03/02/10     Arrival Time: 1:30      Procedure Time: 2:30     Location of Procedure:                    [facility] (4th Floor)                        PREPARATION FOR COLONOSCOPY WITH MOVIPREP   Starting 5 days prior to your procedure 02/22/10 do not eat nuts, seeds, popcorn, corn, beans, peas,  salads, or any raw vegetables.  Do not take any fiber supplements (e.g. Metamucil, Citrucel, and Benefiber).  THE DAY BEFORE YOUR PROCEDURE         DATE: 03/01/10     DAY: Thursday  1.  Drink clear liquids the entire day-NO SOLID FOOD  2.  Do not drink anything colored red or purple.  Avoid juices with pulp.  No orange juice.  3.  Drink at least 64 oz. (8 glasses) of fluid/clear liquids during the day to prevent dehydration and help the prep work efficiently.  CLEAR LIQUIDS INCLUDE: Water Jello Ice Popsicles Tea (sugar ok, no milk/cream) Powdered fruit flavored drinks Coffee (sugar ok, no milk/cream) Gatorade Juice: apple, white grape, white cranberry  Lemonade Clear bullion, consomm, broth Carbonated beverages (any kind) Strained chicken noodle soup Hard Candy                             4.  In the morning, mix first dose of MoviPrep solution:    Empty 1 Pouch A and 1 Pouch B into the disposable container    Add lukewarm drinking water to the top line of the container. Mix to dissolve    Refrigerate (mixed solution should be used within 24 hrs)  5.  Begin drinking the prep at 5:00 p.m. The MoviPrep container is divided by 4 marks.   Every 15 minutes drink the solution down to the next mark (approximately 8 oz) until the full liter is complete.   6.  Follow completed prep with 16 oz of clear liquid of your choice  (Nothing red or purple).  Continue to drink clear liquids until bedtime.  7.  Before going to bed, mix second dose of MoviPrep solution:    Empty 1 Pouch A and 1 Pouch B into the disposable container    Add lukewarm drinking water to the top line of the container. Mix to dissolve    Refrigerate  THE DAY OF YOUR PROCEDURE      DATE: 03/02/10   DAY: Friday  Beginning at 9:30 a.m. (5 hours before procedure):         1. Every 15 minutes, drink the solution down to the next mark (approx 8 oz) until the full liter is complete.  2. Follow completed prep with 16 oz. of clear liquid of your choice.    3. You may drink clear liquids until 12:30  (2 HOURS BEFORE PROCEDURE).   MEDICATION INSTRUCTIONS  Unless otherwise instructed, you should take regular prescription medications with a small sip of water   as early as  possible the morning of your procedure.                   OTHER INSTRUCTIONS  You will need a responsible adult at least 38 years of age to accompany you and drive you home.   This person must remain in the waiting room during your procedure.  Wear loose fitting clothing that is easily removed.  Leave jewelry and other valuables at home.  However, you may wish to bring a book to read or  an iPod/MP3 player to listen to music as you wait for your procedure to start.  Remove all body piercing jewelry and leave at home.  Total time from sign-in until discharge is approximately 2-3 hours.  You should go home directly after your procedure and rest.  You can resume normal activities the  day after your procedure.  The day of your procedure you should not:   Drive   Make legal decisions   Operate machinery   Drink alcohol   Return to work  You will receive specific instructions about eating, activities and medications before you leave.    The above instructions have been reviewed and explained to me by   _______________________    I fully understand  and can verbalize these instructions _____________________________ Date _________

## 2010-08-16 NOTE — Letter (Signed)
Summary: Patient Notice-Endo Biopsy Results  [facility]  [address] [place], [place] [address]   Phone: [phone]  Fax: [phone]        January 19, 2010 MRN: [id number]    Dustin Chan [address] [place], [place]  [address]    Dear Dustin Chan,  I am pleased to inform you that the biopsies taken during your recent endoscopic examination did not show any evidence of cancer upon pathologic examination.H.PYLORI BIOPSY IS NEGATIVE...  Additional information/recommendations:  __No further action is needed at this time.  Please follow-up with      your primary care physician for your other healthcare needs.  __ Please call [phone] to schedule a return visit to review      your condition.  _X_ Continue with the treatment plan as outlined on the day of your      exam.  __ You should have a repeat endoscopic examination for this problem              in _ months/years.   Please call us if you are having persistent problems or have questions about your condition that have not been fully answered at this time.  Sincerely,  Mardella Layman MD [facility]  This letter has been electronically signed by your physician.  Appended Document: Patient Notice-Endo Biopsy Results letter mailed

## 2010-08-16 NOTE — Letter (Signed)
Summary: [facility]  [facility]   Imported By: Lennie Odor 06/08/2010 14:35:34  _____________________________________________________________________  External Attachment:    Type:   Image     Comment:   External Document

## 2010-08-16 NOTE — Progress Notes (Signed)
Summary: Aciphex  Phone Note Outgoing Call   Call placed by: Ashok Cordia RN,  January 18, 2010 8:38 AM Summary of Call: Reminder: Pt needs authorization for Aciphex? Initial call taken by: Ashok Cordia RN,  January 18, 2010 8:39 AM  Follow-up for Phone Call        [facility] [address] Follow-up by: Tawni Levy,  January 18, 2010 8:41 AM  Additional Follow-up for Phone Call Additional follow up Details #1::        Have we rec a req for prior author request from pharmacy? Additional Follow-up by: Ashok Cordia RN,  January 18, 2010 8:54 AM    Additional Follow-up for Phone Call Additional follow up Details #2::    Called [facility] [id number] and spoke with Britta Mccreedy and per Christianne Borrow patient was approved for Aciphex claim number is [id number]...............Marland KitchenCalled pharmacy and told them to run RX

## 2010-08-16 NOTE — Letter (Signed)
Summary: Office Visit Letter  [facility]  [address] [place], [place] [address]   Phone: [phone]  Fax: [phone]      June 26, 2010 MRN: [id number]   Dustin Chan [address] [place], [place]  [address]   Dear [name],   According to our records, it is time for you to schedule a follow-up office visit with [place].   At your convenience, please call [phone] (option #2)to schedule an office visit. If you have any questions, concerns, or feel that this letter is in error, we would appreciate your call.   Sincerely,    Vania Rea. Jarold Motto, M.D.  [facility] [phone]

## 2010-09-26 LAB — APTT: aPTT: 29 seconds (ref 24–37)

## 2010-09-26 LAB — CBC
HCT: 39.8 % (ref 39.0–52.0)
MCV: 84.3 fL (ref 78.0–100.0)
Platelets: 183 10*3/uL (ref 150–400)
RBC: 4.72 MIL/uL (ref 4.22–5.81)
RDW: 14.7 % (ref 11.5–15.5)
WBC: 4.3 10*3/uL (ref 4.0–10.5)

## 2010-09-26 LAB — PROTIME-INR
INR: 1.74 — ABNORMAL HIGH (ref 0.00–1.49)
Prothrombin Time: 20.5 seconds — ABNORMAL HIGH (ref 11.6–15.2)

## 2010-09-26 LAB — SURGICAL PCR SCREEN
MRSA, PCR: NEGATIVE
Staphylococcus aureus: NEGATIVE

## 2010-09-27 LAB — CBC
HCT: 34.7 % — ABNORMAL LOW (ref 39.0–52.0)
HCT: 36.4 % — ABNORMAL LOW (ref 39.0–52.0)
Hemoglobin: 11.8 g/dL — ABNORMAL LOW (ref 13.0–17.0)
MCH: 28.3 pg (ref 26.0–34.0)
MCH: 28.6 pg (ref 26.0–34.0)
MCHC: 34.1 g/dL (ref 30.0–36.0)
MCV: 84.1 fL (ref 78.0–100.0)
Platelets: 285 10*3/uL (ref 150–400)
RBC: 4.22 MIL/uL (ref 4.22–5.81)
RBC: 4.33 MIL/uL (ref 4.22–5.81)
RDW: 13.1 % (ref 11.5–15.5)
RDW: 13.2 % (ref 11.5–15.5)
WBC: 9.3 10*3/uL (ref 4.0–10.5)

## 2010-09-27 LAB — HEPARIN LEVEL (UNFRACTIONATED)
Heparin Unfractionated: 0.43 IU/mL (ref 0.30–0.70)
Heparin Unfractionated: 0.55 IU/mL (ref 0.30–0.70)
Heparin Unfractionated: <0.1 IU/mL — ABNORMAL LOW (ref 0.30–0.70)

## 2010-09-27 LAB — BASIC METABOLIC PANEL
BUN: 10 mg/dL (ref 6–23)
CO2: 30 mEq/L (ref 19–32)
Chloride: 100 mEq/L (ref 96–112)
Potassium: 4.8 mEq/L (ref 3.5–5.1)

## 2010-09-27 LAB — PROTIME-INR
INR: 2.17 — ABNORMAL HIGH (ref 0.00–1.49)
INR: 2.39 — ABNORMAL HIGH (ref 0.00–1.49)
Prothrombin Time: 13.3 seconds (ref 11.6–15.2)
Prothrombin Time: 26.2 seconds — ABNORMAL HIGH (ref 11.6–15.2)

## 2010-09-27 LAB — APTT: aPTT: >200 seconds (ref 24–37)

## 2010-09-28 LAB — CBC
HCT: 32.4 % — ABNORMAL LOW (ref 39.0–52.0)
HCT: 33.8 % — ABNORMAL LOW (ref 39.0–52.0)
HCT: 33.9 % — ABNORMAL LOW (ref 39.0–52.0)
HCT: 35 % — ABNORMAL LOW (ref 39.0–52.0)
Hemoglobin: 11 g/dL — ABNORMAL LOW (ref 13.0–17.0)
Hemoglobin: 11.5 g/dL — ABNORMAL LOW (ref 13.0–17.0)
Hemoglobin: 11.7 g/dL — ABNORMAL LOW (ref 13.0–17.0)
MCH: 27.9 pg (ref 26.0–34.0)
MCH: 28.1 pg (ref 26.0–34.0)
MCH: 28.5 pg (ref 26.0–34.0)
MCH: 28.8 pg (ref 26.0–34.0)
MCHC: 33.9 g/dL (ref 30.0–36.0)
MCHC: 34 g/dL (ref 30.0–36.0)
MCV: 82.6 fL (ref 78.0–100.0)
MCV: 83.5 fL (ref 78.0–100.0)
MCV: 84.1 fL (ref 78.0–100.0)
MCV: 84.9 fL (ref 78.0–100.0)
Platelets: 168 10*3/uL (ref 150–400)
Platelets: 198 10*3/uL (ref 150–400)
Platelets: 211 10*3/uL (ref 150–400)
Platelets: 235 10*3/uL (ref 150–400)
RBC: 3.91 MIL/uL — ABNORMAL LOW (ref 4.22–5.81)
RBC: 4.09 MIL/uL — ABNORMAL LOW (ref 4.22–5.81)
RBC: 4.12 MIL/uL — ABNORMAL LOW (ref 4.22–5.81)
RBC: 4.19 MIL/uL — ABNORMAL LOW (ref 4.22–5.81)
RBC: 4.24 MIL/uL (ref 4.22–5.81)
RDW: 13.1 % (ref 11.5–15.5)
RDW: 13.3 % (ref 11.5–15.5)
RDW: 13.6 % (ref 11.5–15.5)
WBC: 7.4 10*3/uL (ref 4.0–10.5)
WBC: 8.1 10*3/uL (ref 4.0–10.5)
WBC: 8.4 10*3/uL (ref 4.0–10.5)

## 2010-09-28 LAB — BASIC METABOLIC PANEL
BUN: 7 mg/dL (ref 6–23)
BUN: 8 mg/dL (ref 6–23)
BUN: 9 mg/dL (ref 6–23)
CO2: 25 mEq/L (ref 19–32)
CO2: 30 mEq/L (ref 19–32)
Chloride: 100 mEq/L (ref 96–112)
Chloride: 101 mEq/L (ref 96–112)
Chloride: 104 mEq/L (ref 96–112)
Creatinine, Ser: 1.01 mg/dL (ref 0.4–1.5)
Creatinine, Ser: 1.16 mg/dL (ref 0.4–1.5)
Creatinine, Ser: 1.24 mg/dL (ref 0.4–1.5)
GFR calc Af Amer: >60 mL/min (ref >60)
GFR calc Af Amer: >60 mL/min (ref >60)
GFR calc non Af Amer: >60 mL/min (ref >60)
Glucose, Bld: 102 mg/dL — ABNORMAL HIGH (ref 70–99)
Glucose, Bld: 97 mg/dL (ref 70–99)
Potassium: 3.8 mEq/L (ref 3.5–5.1)
Potassium: 3.9 mEq/L (ref 3.5–5.1)

## 2010-09-28 LAB — LUPUS ANTICOAGULANT PANEL
DRVVT: 68.2 secs — ABNORMAL HIGH (ref 36.2–44.3)
Lupus Anticoagulant: DETECTED — AB
PTT Lupus Anticoagulant: 57.7 secs — ABNORMAL HIGH (ref 30.0–45.6)

## 2010-09-28 LAB — PROTEIN C, TOTAL: Protein C, Total: 99 % (ref 70–140)

## 2010-09-28 LAB — HEPARIN LEVEL (UNFRACTIONATED)
Heparin Unfractionated: 0.28 IU/mL — ABNORMAL LOW (ref 0.30–0.70)
Heparin Unfractionated: 0.35 IU/mL (ref 0.30–0.70)
Heparin Unfractionated: 0.43 IU/mL (ref 0.30–0.70)

## 2010-09-28 LAB — PROTIME-INR
INR: 1.4 (ref 0.00–1.49)
INR: 2.03 — ABNORMAL HIGH (ref 0.00–1.49)
INR: 2.1 — ABNORMAL HIGH (ref 0.00–1.49)
Prothrombin Time: 17.4 seconds — ABNORMAL HIGH (ref 11.6–15.2)
Prothrombin Time: 23.1 seconds — ABNORMAL HIGH (ref 11.6–15.2)

## 2010-09-28 LAB — APTT: aPTT: 34 seconds (ref 24–37)

## 2010-09-28 LAB — PROTEIN C ACTIVITY: Protein C Activity: 133 % (ref 75–133)

## 2010-09-28 LAB — PROTEIN S, TOTAL: Protein S Ag, Total: 112 % (ref 70–140)

## 2010-09-28 LAB — DIFFERENTIAL
Eosinophils Absolute: 0.4 10*3/uL (ref 0.0–0.7)
Eosinophils Relative: 4 % (ref 0–5)
Lymphs Abs: 1.2 10*3/uL (ref 0.7–4.0)

## 2010-09-28 LAB — PROTEIN S ACTIVITY: Protein S Activity: 61 % — ABNORMAL LOW (ref 69–129)

## 2010-09-28 LAB — CARDIOLIPIN ANTIBODIES, IGG, IGM, IGA: Anticardiolipin IgA: 2 APL U/mL — ABNORMAL LOW (ref <22)

## 2010-09-28 LAB — D-DIMER, QUANTITATIVE: D-Dimer, Quant: 7.23 ug/mL-FEU — ABNORMAL HIGH (ref 0.00–0.48)

## 2011-11-07 IMAGING — US US ABDOMEN COMPLETE
1 series · 14 of 25 positions shown · non-contrast
Comparison: None

CLINICAL DATA: Left upper quadrant pain.  Rectal bleeding and
weight loss.

ABDOMINAL ULTRASOUND COMPLETE

[Series 1: us abdomen complete · 0.30mm/px · 14 of 74 slices shown]
[im 1/74]
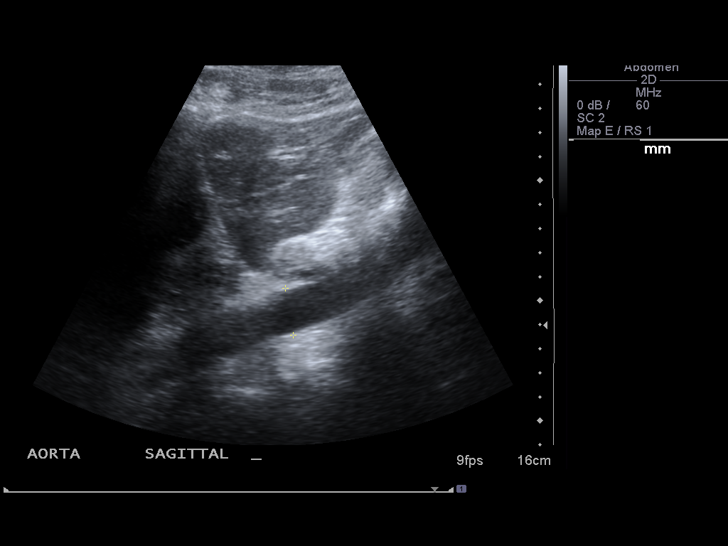
[im 7/74]
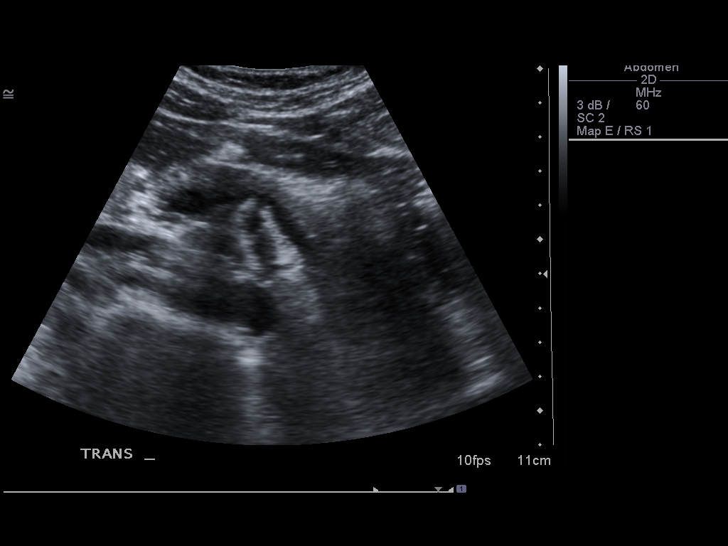
[im 13/74]
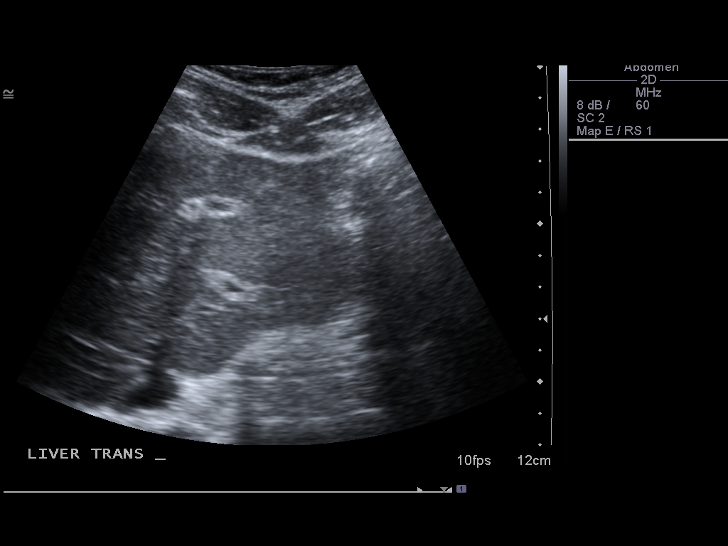
[im 19/74]
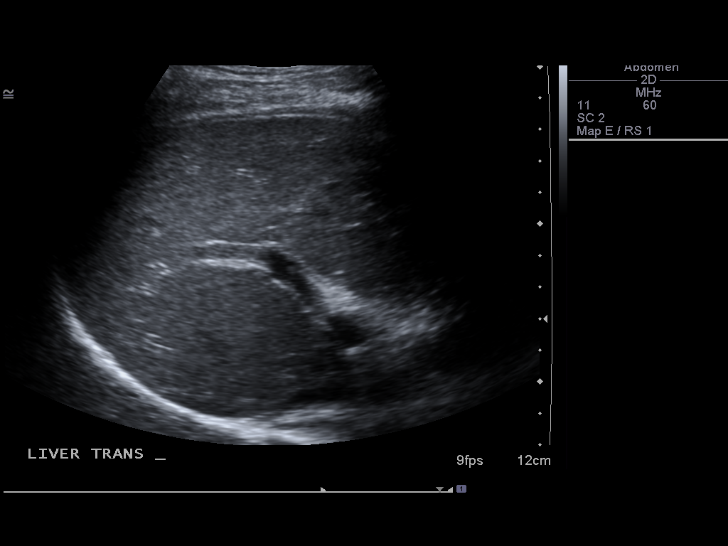
[im 25/74]
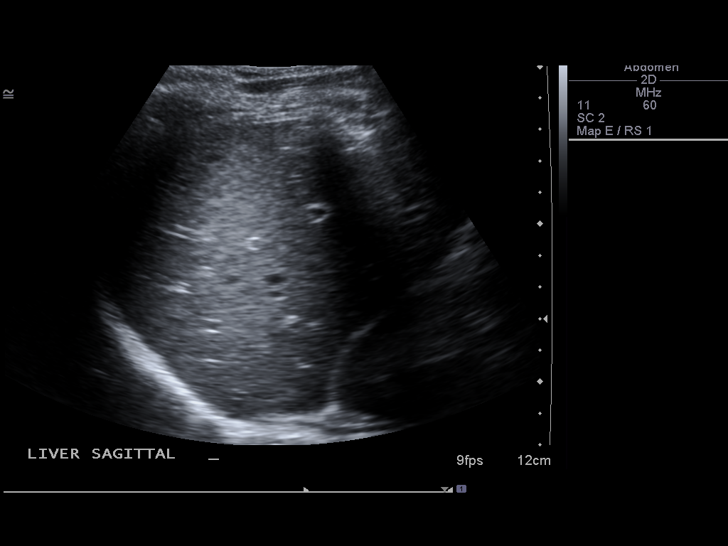
[im 28/74]
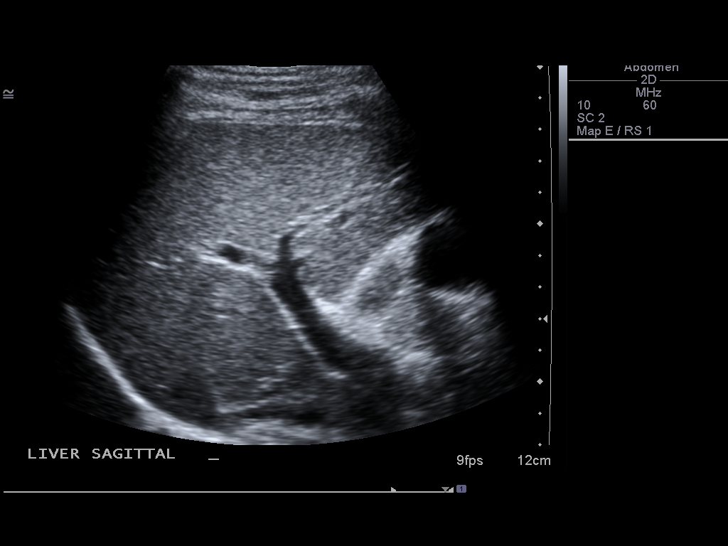
[im 34/74]
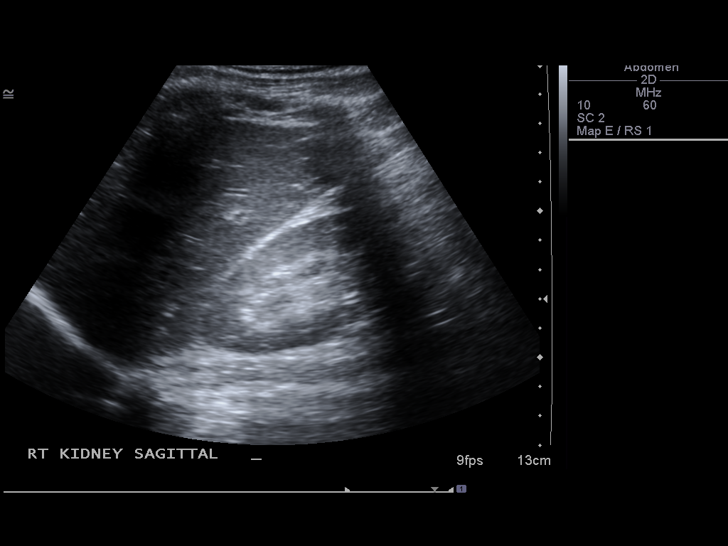
[im 40/74]
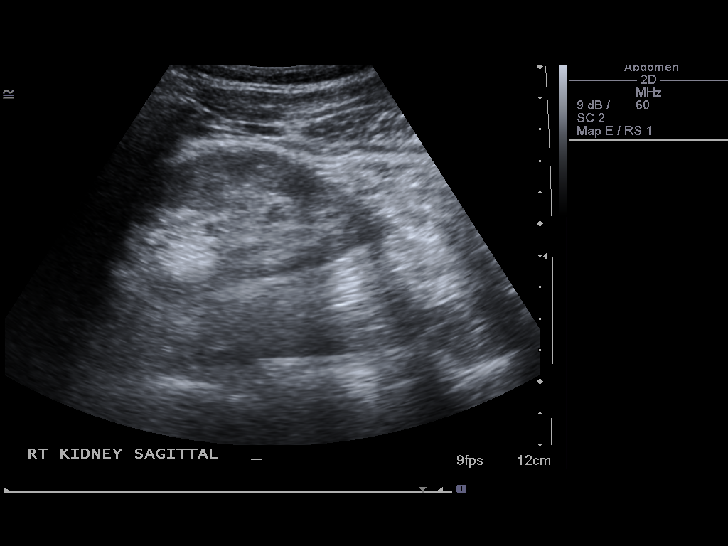
[im 46/74]
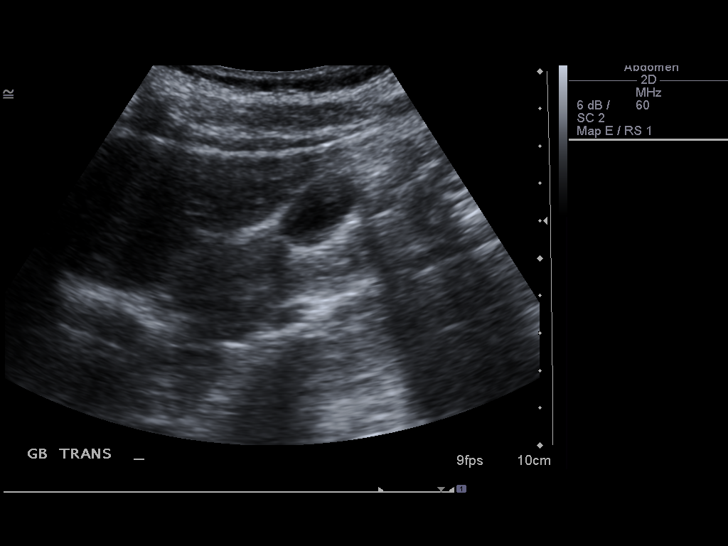
[im 49/74]
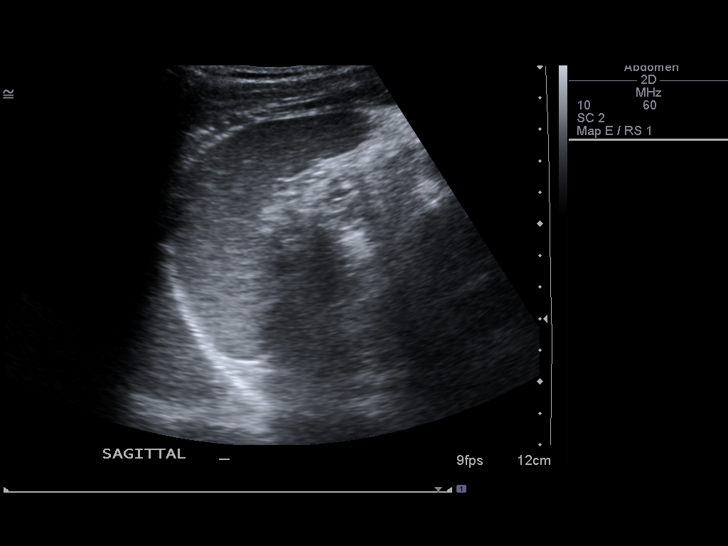
[im 55/74]
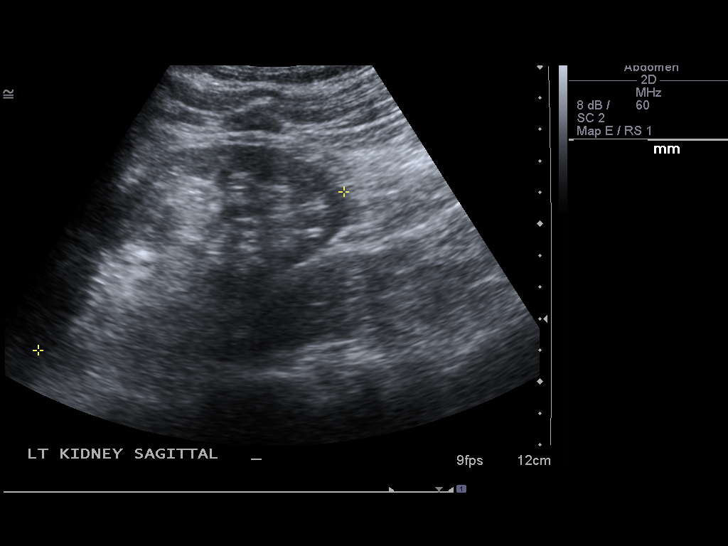
[im 61/74]
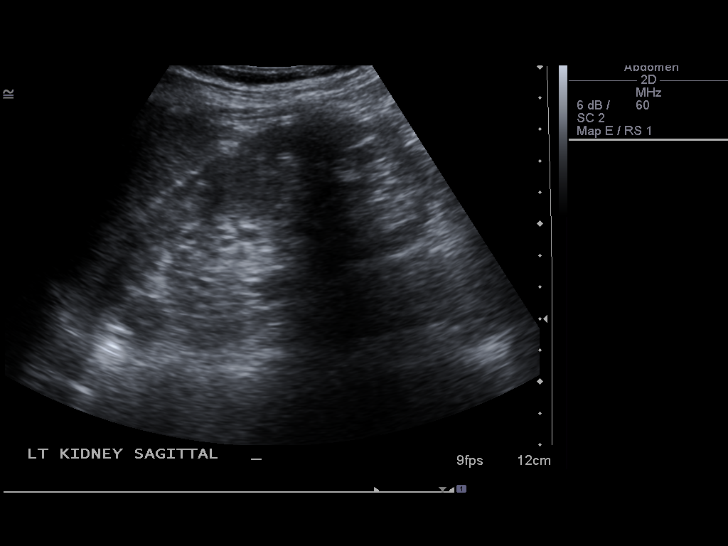
[im 67/74]
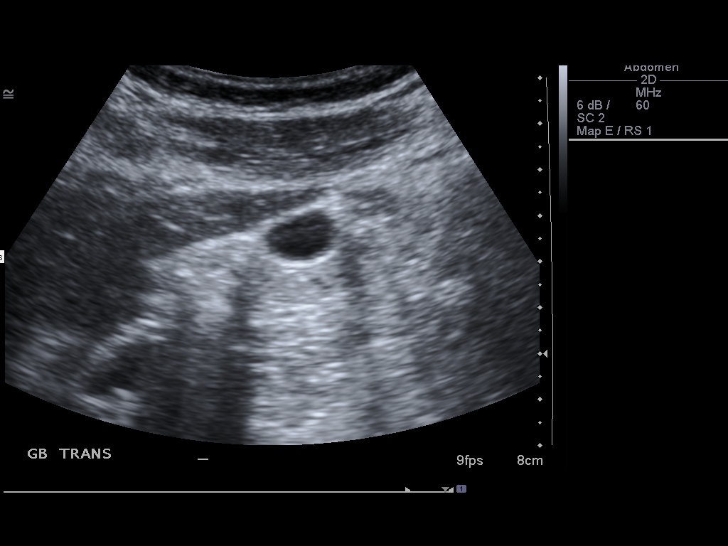
[im 74/74]
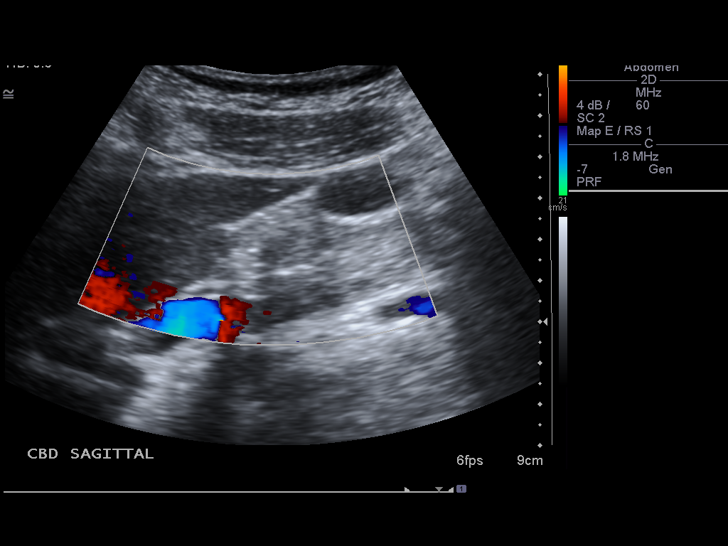

[14 of 25 positions shown; findings below may reference images not displayed]

FINDINGS: Gallbladder:  No gallstones, gallbladder wall thickening, or
pericholecystic fluid.

Common Bile Duct:  Within normal limits in caliber.

Liver: No focal mass lesion identified.  Within normal limits in
parenchymal echogenicity.

IVC:  Appears normal.

Pancreas:  No abnormality identified, although entire pancreas
cannot be visualized by ultrasound.

Spleen:  Within normal limits in size and echotexture.

Right kidney:  Normal in size and parenchymal echogenicity.  No
evidence of mass or hydronephrosis.

Left kidney:  Normal in size and parenchymal echogenicity.  No
evidence of mass or hydronephrosis.

Abdominal Aorta:  No aneurysm identified.
IMPRESSION: Negative abdominal ultrasound.

## 2012-01-05 IMAGING — US US SCROTUM
1 series · 14 of 25 positions shown · non-contrast
Comparison: None available.

CLINICAL DATA: Left-sided testicular mass.

ULTRASOUND OF SCROTUM
TECHNIQUE: Complete ultrasound examination of the testicles,
epididymis, and other scrotal structures was performed.

[Series 1: us scrotum · 0.08mm/px · 14 of 60 slices shown]
[im 1/60]
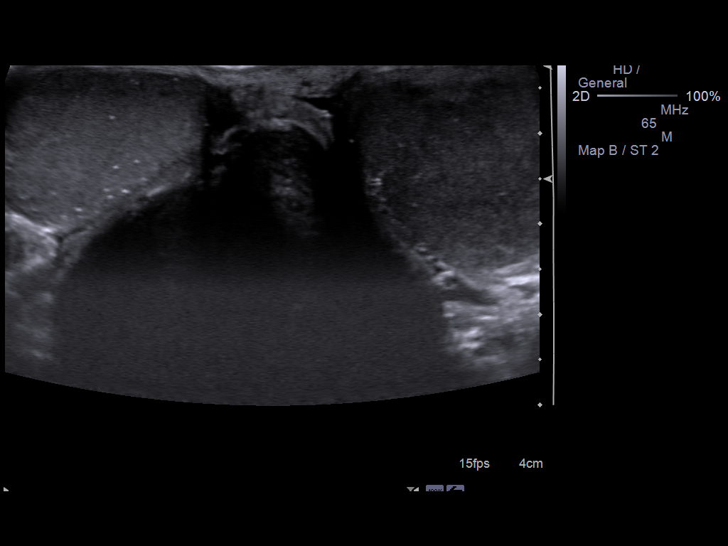
[im 5/60]
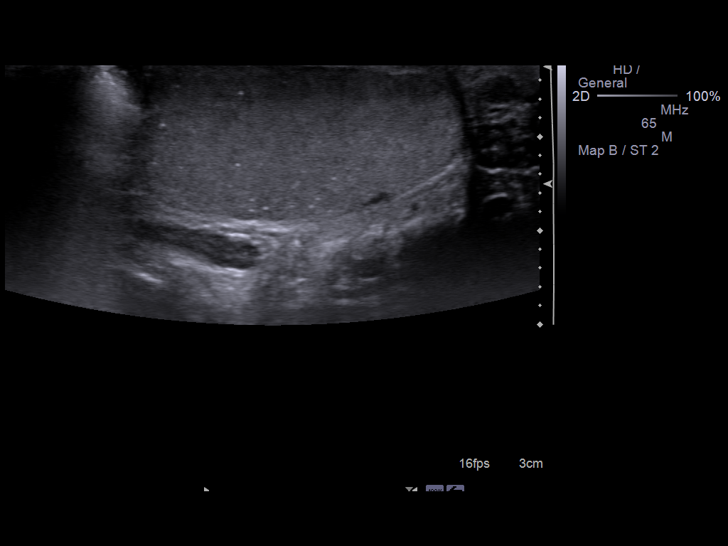
[im 10/60]
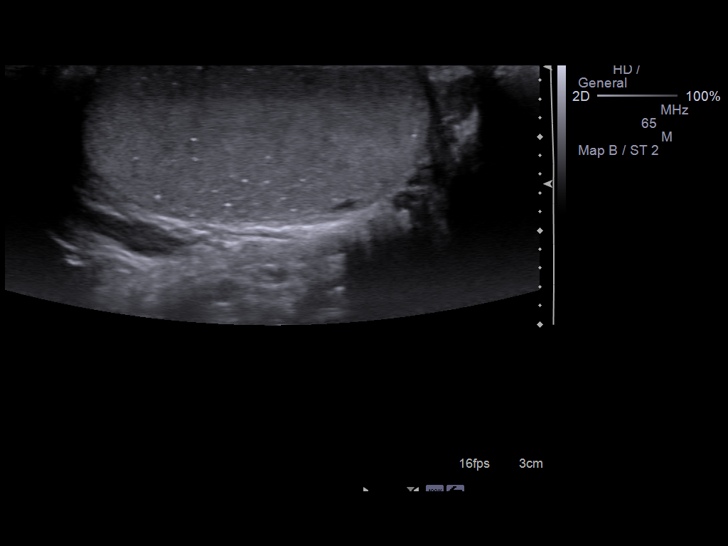
[im 15/60]
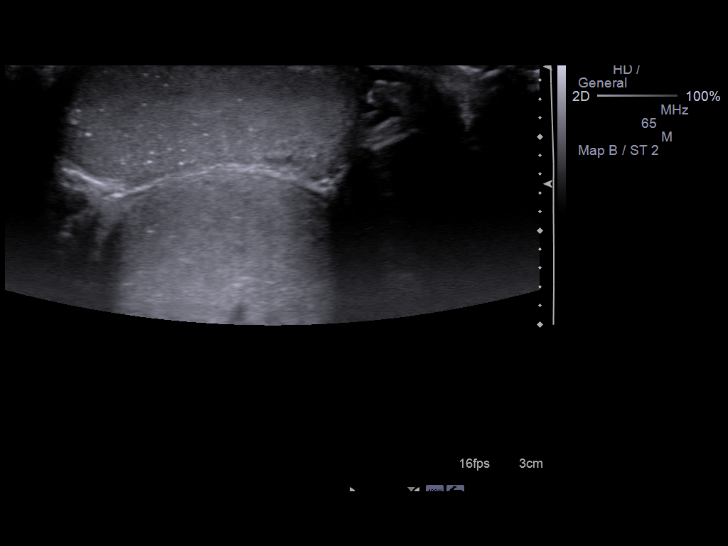
[im 20/60]
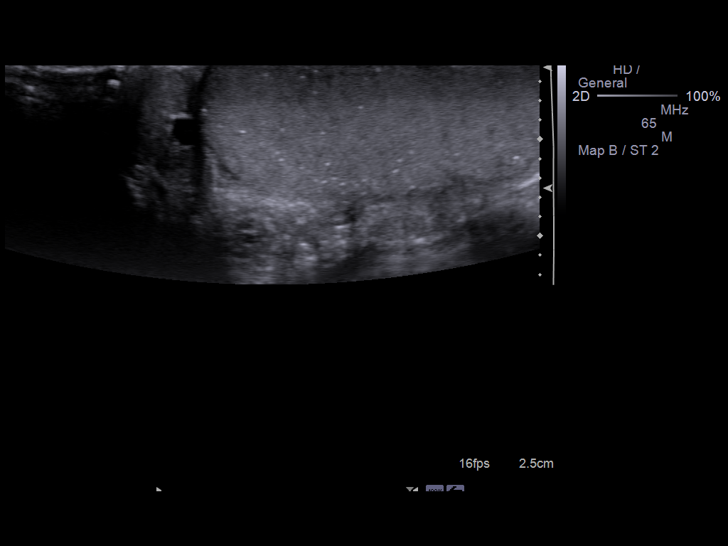
[im 23/60]
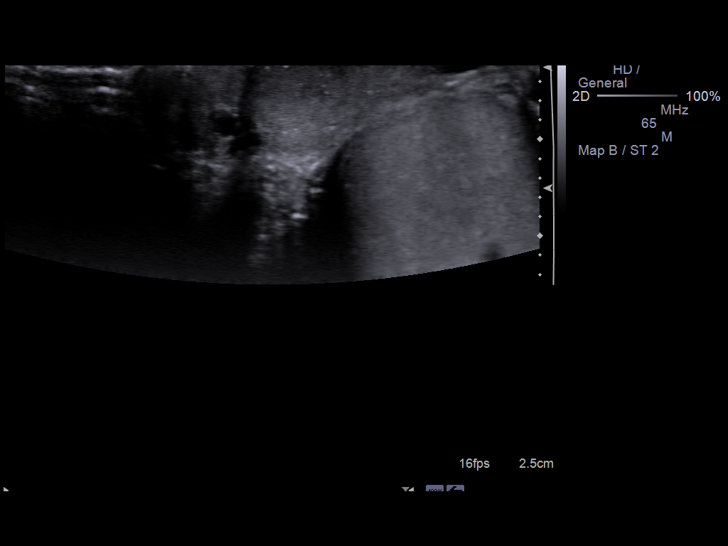
[im 28/60]
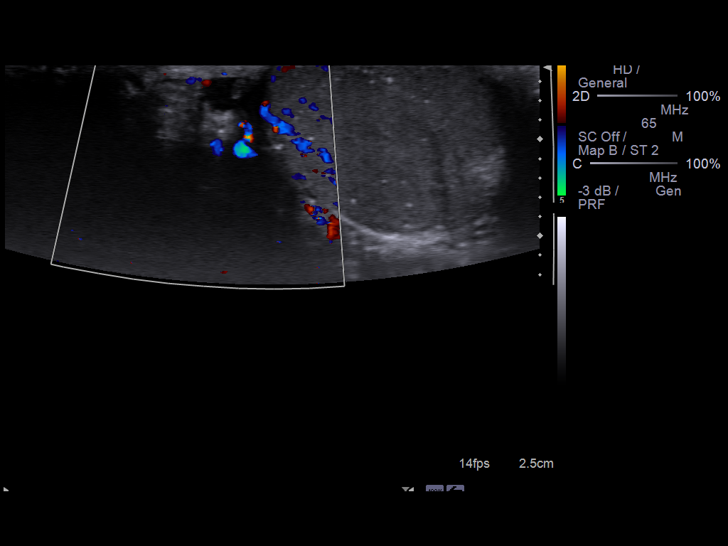
[im 32/60]
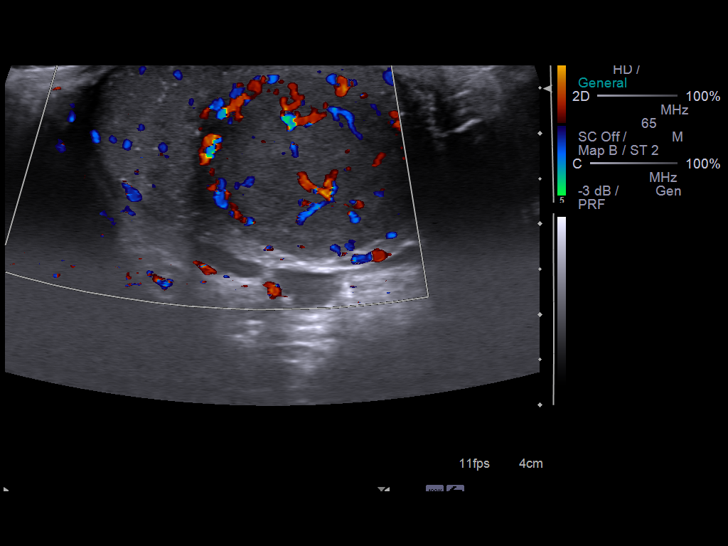
[im 37/60]
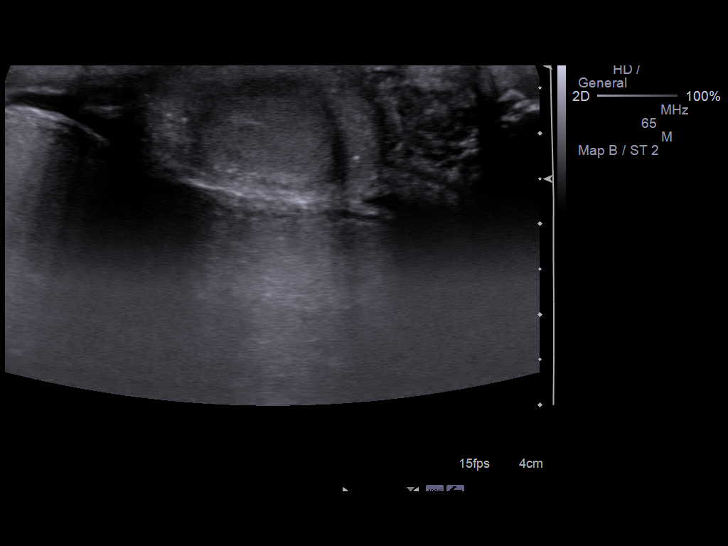
[im 40/60]
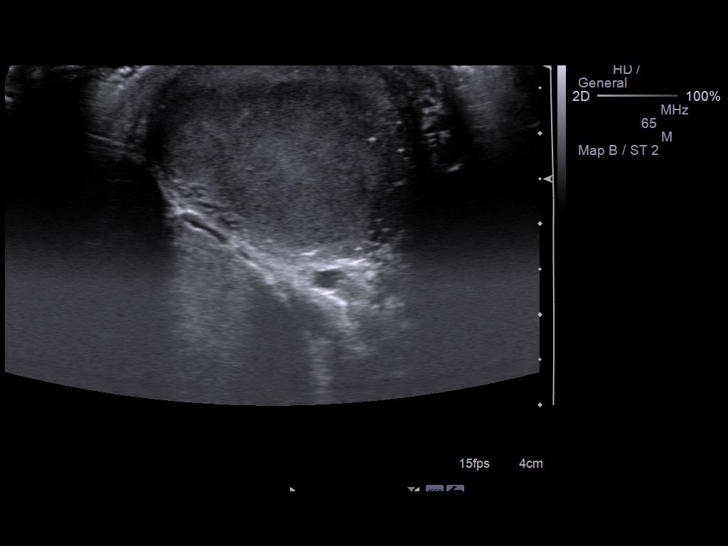
[im 45/60]
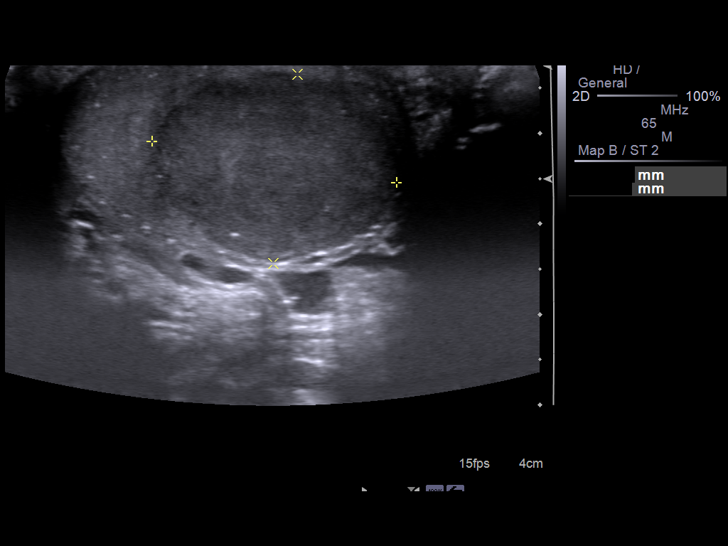
[im 50/60]
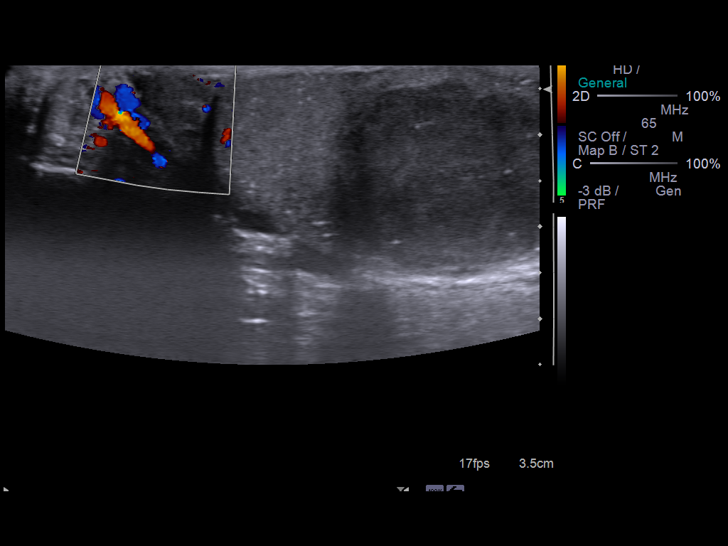
[im 55/60]
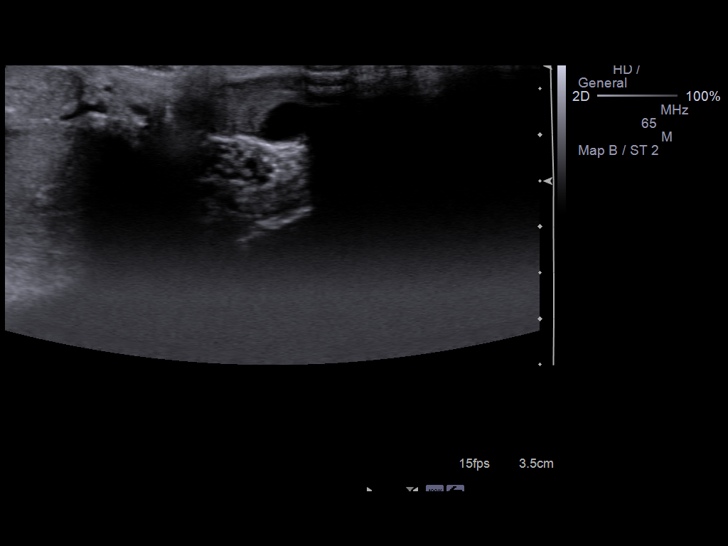
[im 60/60]
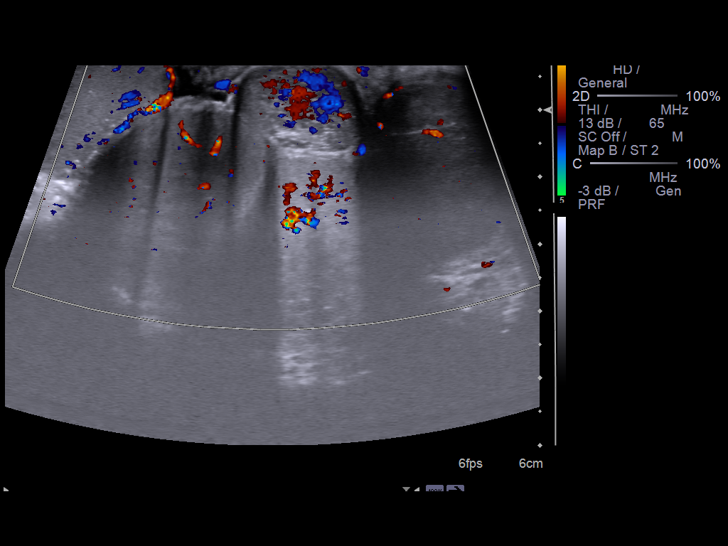

[14 of 25 positions shown; findings below may reference images not displayed]

FINDINGS: Right testicle 3.6 x 1.6 x 3.2 cm. Testicular
microlithiasis.  No right-sided mass.  Symmetric color flow.

The left testicle measures 4.0 x 2.2 x 3.0 cm.  Within the left
testicle is a mildly hypoechoic mass which measures 2.7 x 2.1 x
cm.  Moderately hypervascular.  Testicular microlithiasis.

Bilateral epididymal cysts versus spermatoceles.  3 mm on the right
and 7 mm on the left.  No evidence of hydrocele or varicocele.
IMPRESSION: A left-sided testicular mass is confirmed.  This is most consistent
with a primary testicular neoplasm. This report was called to
nurse, [REDACTED], on 03/16/10 at [DATE] by my [place], [place].

Underlying testicular microlithiasis.

## 2013-09-27 ENCOUNTER — Other Ambulatory Visit: Payer: Self-pay | Admitting: Family Medicine

## 2013-09-27 ENCOUNTER — Ambulatory Visit
Admission: RE | Admit: 2013-09-27 | Discharge: 2013-09-27 | Disposition: A | Discharge status: Home / Self Care | Payer: BC Managed Care – PPO | Source: Ambulatory Visit | Attending: Family Medicine | Admitting: Family Medicine

## 2013-09-27 DIAGNOSIS — F17200 Nicotine dependence, unspecified, uncomplicated: Secondary | ICD-10-CM

## 2015-04-05 ENCOUNTER — Encounter: Payer: Self-pay | Admitting: Gastroenterology

## 2015-07-19 IMAGING — CR DG CHEST 2V
2 series · 2 of 2 positions shown · non-contrast
Comparison: CT ANGIO CHEST W/CM &/OR WO/CM dated 03/12/2010

CLINICAL DATA: Cough, smoker.

EXAM:
CHEST  2 VIEW

[w chest pa]
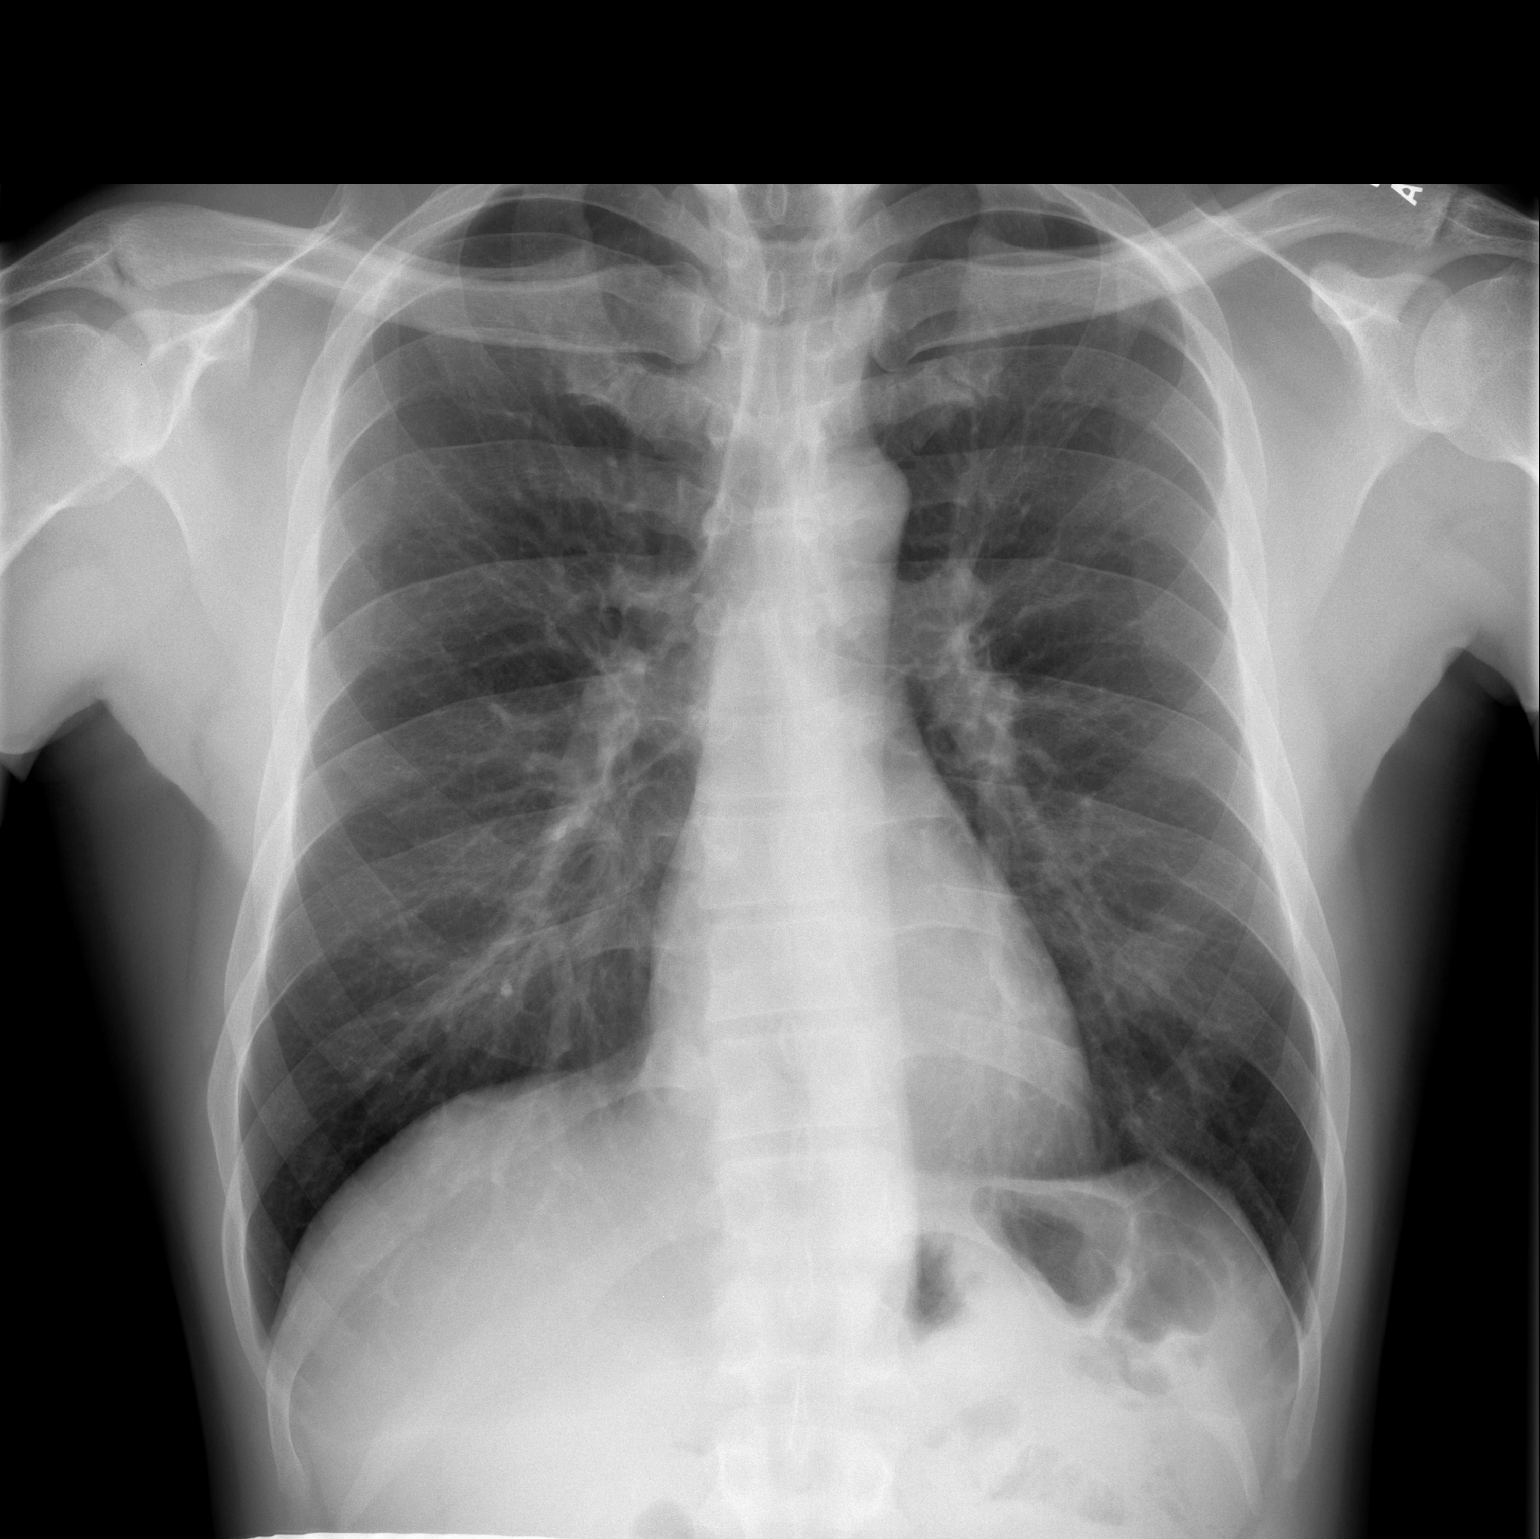

[w chest lat]
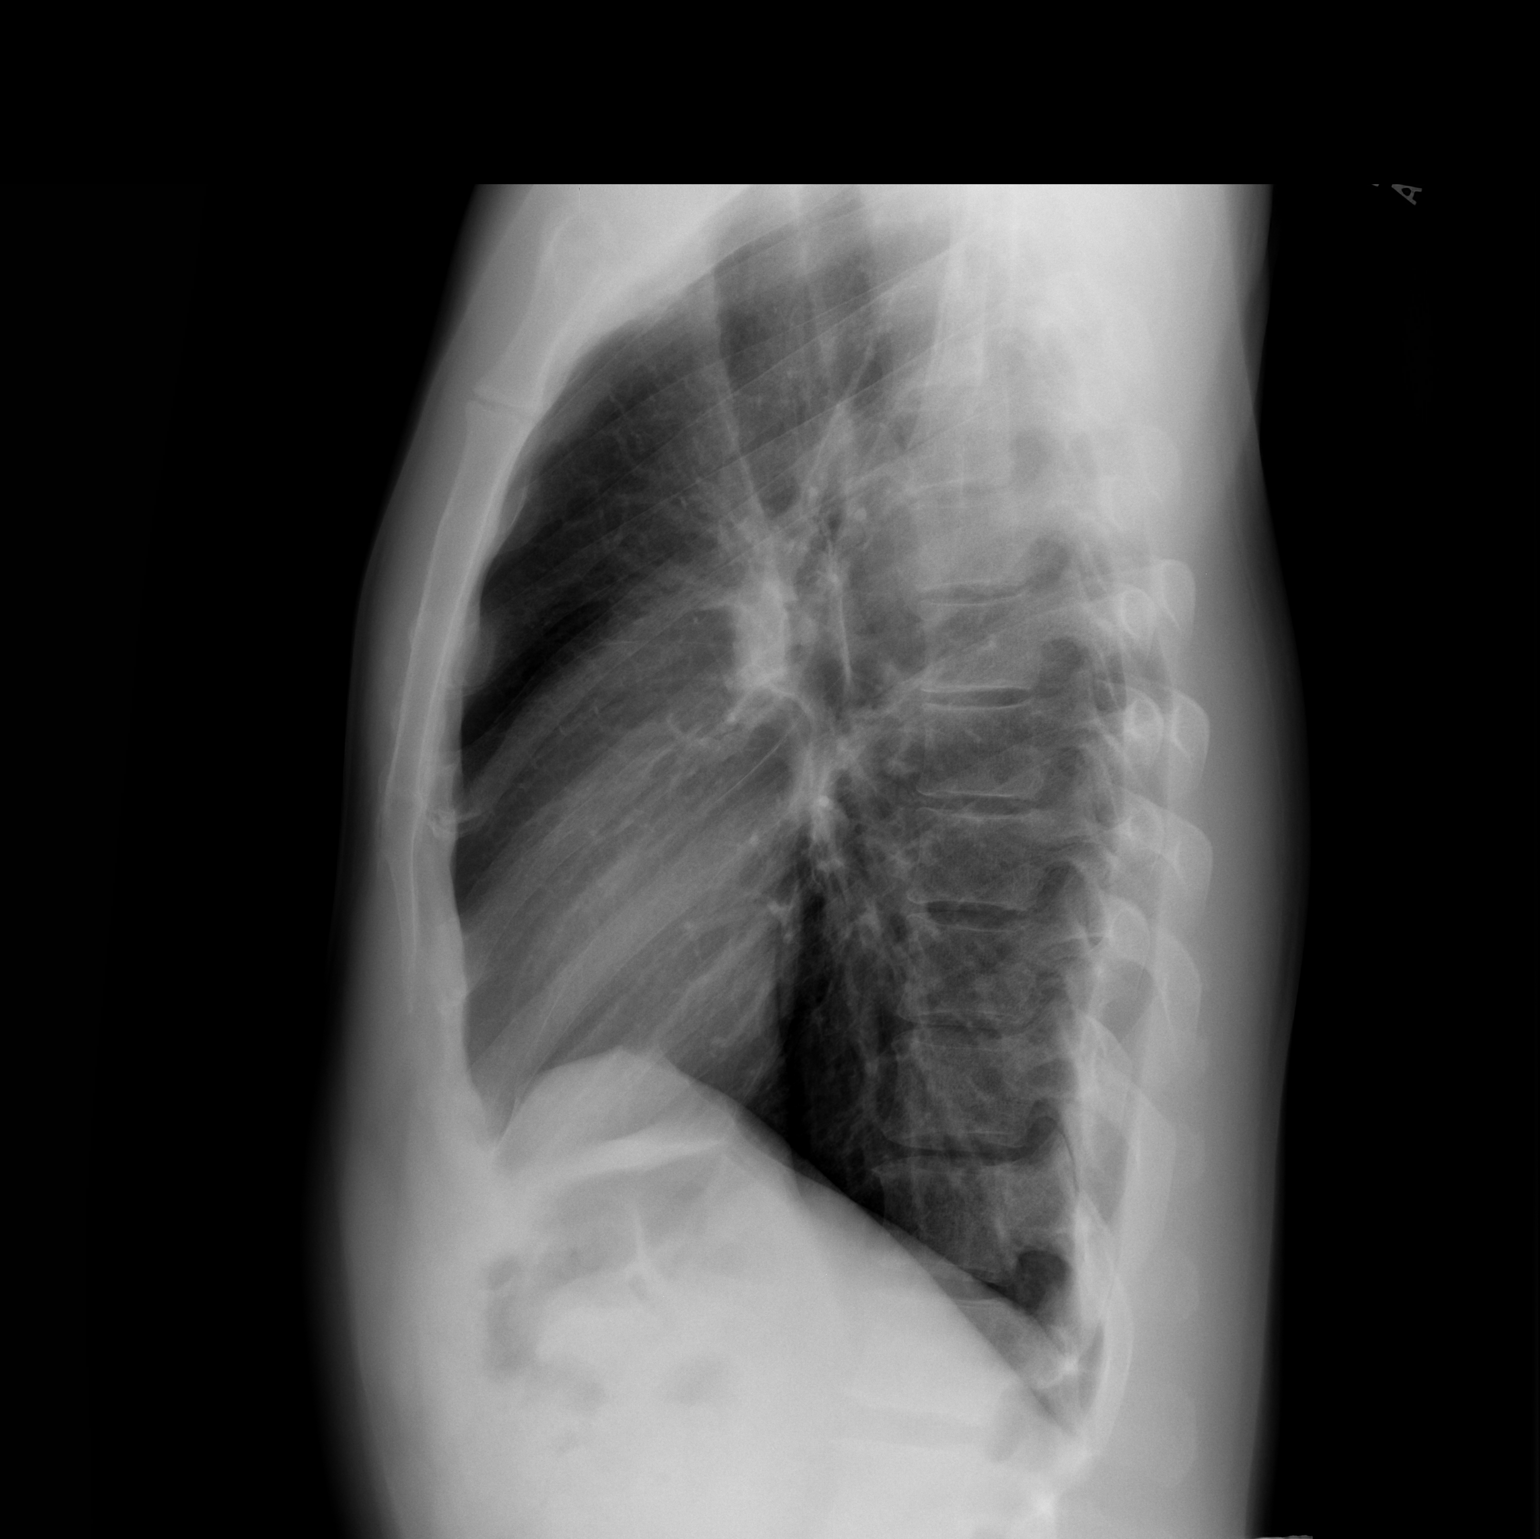

[2 of 2 positions shown; findings below may reference images not displayed]

FINDINGS: Trachea is midline. Heart size normal. Lungs are hyperinflated but
clear. No pleural fluid.
IMPRESSION: Hyperinflation without acute finding.

## 2017-12-26 ENCOUNTER — Encounter: Payer: Self-pay | Admitting: Family Medicine

## 2017-12-26 ENCOUNTER — Ambulatory Visit (INDEPENDENT_AMBULATORY_CARE_PROVIDER_SITE_OTHER): Payer: Managed Care, Other (non HMO) | Admitting: Family Medicine

## 2017-12-26 ENCOUNTER — Other Ambulatory Visit: Payer: Self-pay

## 2017-12-26 VITALS — BP 132/87 | HR 66 | Temp 98.2°F | Ht 70.0 in | Wt 169.0 lb

## 2017-12-26 DIAGNOSIS — R12 Heartburn: Secondary | ICD-10-CM

## 2017-12-26 DIAGNOSIS — K625 Hemorrhage of anus and rectum: Secondary | ICD-10-CM

## 2017-12-26 DIAGNOSIS — K529 Noninfective gastroenteritis and colitis, unspecified: Secondary | ICD-10-CM

## 2017-12-26 DIAGNOSIS — K6289 Other specified diseases of anus and rectum: Secondary | ICD-10-CM | POA: Diagnosis not present

## 2017-12-26 LAB — POCT CBC
GRANULOCYTE PERCENT: 61.9 % (ref 37–80)
HEMATOCRIT: 45.1 % (ref 43.5–53.7)
HEMOGLOBIN: 14.9 g/dL (ref 14.1–18.1)
Lymph, poc: 1.7 (ref 0.6–3.4)
MCH: 27.8 pg (ref 27–31.2)
MCHC: 33.1 g/dL (ref 31.8–35.4)
MCV: 84 fL (ref 80–97)
MID (CBC): 0.4 (ref 0–0.9)
MPV: 8.4 fL (ref 0–99.8)
POC GRANULOCYTE: 3.5 (ref 2–6.9)
POC LYMPH PERCENT: 30.5 %L (ref 10–50)
POC MID %: 7.6 % (ref 0–12)
Platelet Count, POC: 199 10*3/uL (ref 142–424)
RBC: 5.37 M/uL (ref 4.69–6.13)
RDW, POC: 13.2 %
WBC: 5.6 10*3/uL (ref 4.6–10.2)

## 2017-12-26 LAB — IFOBT (OCCULT BLOOD): IFOBT: NEGATIVE

## 2017-12-26 MED ORDER — MESALAMINE 1.2 G PO TBEC: 1.2000 g | DELAYED_RELEASE_TABLET | Freq: Every day | ORAL | 0 refills | Status: DC

## 2017-12-26 MED ORDER — OMEPRAZOLE 20 MG PO CPDR: 20.0000 mg | DELAYED_RELEASE_CAPSULE | Freq: Every day | ORAL | 1 refills | Status: DC

## 2017-12-26 NOTE — Patient Instructions (Addendum)
Cut back on alcohol for now, and avoid some of the other foods that can worsen heartburn below. Ok to start omeprazole once per day for now as well.  For rectal discomfort, I would like you to meet with gastroenterology again to see if other testing needed.  Based on previous records and your prior prescription, I am suspicious for inflammatory bowel disease with a current flare.,  Symptoms are possibly slightly improved today, will start with just oral Lialda 1 pill once per day.  If you are not continuing to improve into next week, please return for recheck. Return to the clinic or go to the nearest emergency room if any of your symptoms worsen or new symptoms occur.   Rectal Bleeding Rectal bleeding is when blood passes out of the anus. People with rectal bleeding may notice bright red blood in their underwear or in the toilet after having a bowel movement. They may also have dark red or black stools. Rectal bleeding is usually a sign that something is wrong. Many things can cause rectal bleeding, including:  Hemorrhoids. These are blood vessels in the anus or rectum that are larger than normal.  Fistulas. These are abnormal passages in the rectum and anus.  Anal fissures. This is a tear in the anus.  Diverticulosis. This is a condition in which pockets or sacs project from the bowel.  Proctitis and colitis. These are conditions in which the rectum, colon, or anus become inflamed.  Polyps. These are growths that can be cancerous (malignant) or non-cancerous (benign).  Part of the rectum sticking out from the anus (rectal prolapse).  Certain medicines.  Intestinal infections.  Follow these instructions at home: Pay attention to any changes in your symptoms. Take these actions to help lessen bleeding and discomfort:  Eat a diet that is high in fiber. This will keep your stool soft, making it easier to pass stools without straining. Ask your health care provider what foods and drinks  are high in fiber.  Drink enough fluid to keep your urine clear or pale yellow. This also helps to keep your stool soft.  Try taking a warm bath. This may help soothe any pain in your rectum.  Keep all follow-up visits as told by your health care provider. This is important.  Get help right away if:  You have new or increased rectal bleeding.  You have black or dark red stools.  You vomit blood or something that looks like coffee grounds.  You have pain or tenderness in your abdomen.  You have a fever.  You feel weak.  You feel nauseous.  You faint.  You have severe pain in your rectum.  You cannot have a bowel movement. This information is not intended to replace advice given to you by your health care provider. Make sure you discuss any questions you have with your health care provider. Document Released: 12/21/2001 Document Revised: 12/07/2015 Document Reviewed: 08/27/2015 Elsevier Interactive Patient Education  2018 [employer].     IF you received an x-ray today, you will receive an invoice from [facility]. Please contact [facility] at [phone] with questions or concerns regarding your invoice.   IF you received labwork today, you will receive an invoice from [facility]. Please contact [facility] at [phone] with questions or concerns regarding your invoice.   Our billing staff will not be able to assist you with questions regarding bills from these companies.  You will be contacted with the lab results as soon as they  are available. The fastest way to get your results is to activate your My Chart account. Instructions are located on the last page of this paperwork. If you have not heard from [place] regarding the results in 2 weeks, please contact this office.

## 2017-12-26 NOTE — Progress Notes (Addendum)
Subjective:  By signing my name below, I, Essence Howell, attest that this documentation has been prepared under the direction and in the presence of Wendie Agreste, MD Electronically Signed: Ladene Artist, ED Scribe 12/26/2017 at 5:11 PM.   Patient ID: Dustin Chan, male    DOB: 1971-02-01, 47 y.o.   MRN: [id number]  Chief Complaint  Patient presents with  . Establish Care    pink blood in stool. discomfort on both sides. (yesterday)   HPI Dustin Chan is a 47 y.o. male who presents to Primary Care at [facility] to establish care. New pt to me. H/o inflammatory bowel disease, gout, rectal bleeding. Colonoscopy 02/2010 by Dr. Sharlett Iles for eval of rectal bleeding and abdominal pain. Had erythema in rectum and sigmoid colon, otherwise normal. Biopsy was consistent with chronic, active colitis, consistent with inflammatory bowel disease.  Pt states that he was doing fine until ~2 wks ago. States he noticed looser stools with bright red blood, intermittent gurgling/bubbling, some discomfort with BMs but states it was just mucus. Denies blood in stools today; pt suspects that blood in stools has faded due to drinking a lot of water and eating lighter meals. He noticed that symptoms seem to be worse when he drinks alcohol on an empty stomach and lays down, eating later in the evening, straining during BMs. 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 Pt has approximately 2-3 beers/wk and about 8-10 shots of liquor/wk. Also reports intermittent acid reflux. Occasionally takes Aleve and ibuprofen when he is sore from work or has dental work done. Denies melena, dysuria, hematuria, rectal/abdominal pain, fever, night sweats, unexplained weight loss, nausea, vomiting, lightheadedness, dizziness. He has previously prescribed lialda at home which he took prn but did not try due to blood in stools. No known  diagnosis of ulcerative colitis or crohn's. Last visit with Dr. Sharlett Iles was in 2011.  H/o Testicular CA Diagnosed in 2011. Pt has noticed weaker stream but states that this is unchanged for several yrs.  Patient Active Problem List   Diagnosis Date Noted  . HIATAL HERNIA WITH REFLUX 04/05/2010  . INFLAMMATORY BOWEL DISEASE 04/05/2010  . GOUT 02/02/2010  . OTHER SPEC DRUG DEPENDENCE UNSPEC ABUSE 01/12/2010  . IBS 01/12/2010  . RECTAL BLEEDING 01/12/2010  . ABDOMINAL PAIN, LEFT UPPER QUADRANT 01/12/2010   No past medical history on file. No Known Allergies Prior to Admission medications   Not on File   Social History   Socioeconomic History  . Marital status: Married    Spouse name: Not on file  . Number of children: Not on file  . Years of education: Not on file  . Highest education level: Not on file  Occupational History  . Not on file  Social Needs  . Financial resource strain: Not on file  . Food insecurity:    Worry: Not on file    Inability: Not on file  . Transportation needs:    Medical: Not on file    Non-medical: Not on file  Tobacco Use  . Smoking status: Current Every Day Smoker    Types: Cigarettes  . Smokeless tobacco: Never Used  Substance and Sexual Activity  . Alcohol use: Yes  . Drug use: Never  . Sexual activity: Yes  Lifestyle  . Physical activity:    Days per week: Not on file    Minutes  per session: Not on file  . Stress: Not on file  Relationships  . Social connections:    Talks on phone: Not on file    Gets together: Not on file    Attends religious service: Not on file    Active member of club or organization: Not on file    Attends meetings of clubs or organizations: Not on file    Relationship status: Not on file  . Intimate partner violence:    Fear of current or ex partner: Not on file    Emotionally abused: Not on file    Physically abused: Not on file    Forced sexual activity: Not on file  Other Topics Concern  . Not on  file  Social History Narrative  . Not on file   Review of Systems  Constitutional: Negative for diaphoresis, fever and unexpected weight change.  Gastrointestinal: Positive for blood in stool. Negative for abdominal pain, nausea, rectal pain and vomiting.  Genitourinary: Negative for dysuria and hematuria.  Neurological: Negative for dizziness and light-headedness.      Objective:   Physical Exam  Constitutional: He is oriented to person, place, and time. He appears well-developed and well-nourished. No distress.  HENT:  Head: Normocephalic and atraumatic.  Eyes: Conjunctivae and EOM are normal.  Neck: Neck supple. No tracheal deviation present.  Cardiovascular: Normal rate.  Pulmonary/Chest: Effort normal. No respiratory distress.  Abdominal: Soft. He exhibits no distension. There is no tenderness.  Genitourinary:  Genitourinary Comments: Few external hemorrhoids, non-thrombosed. No active bleeding. No fissures. Discomfort but no blood or stool noted. Normal tone.  Musculoskeletal: Normal range of motion.  Neurological: He is alert and oriented to person, place, and time.  Skin: Skin is warm and dry.  Psychiatric: He has a normal mood and affect. His behavior is normal.  Nursing note and vitals reviewed.  Vitals:   12/26/17 1650  BP: 132/87  Pulse: 66  Temp: 98.2 F (36.8 C)  TempSrc: Oral  SpO2: 100%  Weight: 169 lb (76.7 kg)  Height: 5\' 10"  (1.778 m)   Results for orders placed or performed in visit on 12/26/17  POCT CBC  Result Value Ref Range   WBC 5.6 4.6 - 10.2 K/uL   Lymph, poc 1.7 0.6 - 3.4   POC LYMPH PERCENT 30.5 10 - 50 %L   MID (cbc) 0.4 0 - 0.9   POC MID % 7.6 0 - 12 %M   POC Granulocyte 3.5 2 - 6.9   Granulocyte percent 61.9 37 - 80 %G   RBC 5.37 4.69 - 6.13 M/uL   Hemoglobin 14.9 14.1 - 18.1 g/dL   HCT, POC 45.1 43.5 - 53.7 %   MCV 84.0 80 - 97 fL   MCH, POC 27.8 27 - 31.2 pg   MCHC 33.1 31.8 - 35.4 g/dL   RDW, POC 13.2 %   Platelet Count, POC  199 142 - 424 K/uL   MPV 8.4 0 - 99.8 fL  IFOBT POC (occult bld, rslt in office)  Result Value Ref Range   IFOBT Negative        Assessment & Plan:  .  Dustin Chan is a 47 y.o. male Rectal bleeding - Plan: Sedimentation Rate, POCT CBC, IFOBT POC (occult bld, rslt in office), IFOBT POC (occult bld, rslt in office), Ambulatory referral to Gastroenterology, mesalamine (LIALDA) 1.2 g EC tablet  Proctalgia - Plan: Sedimentation Rate, POCT CBC, IFOBT POC (occult bld, rslt in office), IFOBT POC (occult bld,  rslt in office), Ambulatory referral to Gastroenterology, mesalamine (LIALDA) 1.2 g EC tablet  Heartburn - Plan: omeprazole (PRILOSEC) 20 MG capsule  IBD (inflammatory bowel disease) - Plan: mesalamine (LIALDA) 1.2 g EC tablet  Suspect recurrence of inflammatory bowel disease based on previous records reviewed as well as prior treatment with Lialda.  Does report some stability/slight improvement of symptoms, and heme-negative on testing in office.  Abdomen was also soft/nontender.  -Refilled the Lialda at 1.2 g/day for now, may need higher doses depending on symptom control.  Importance of gastroenterology follow-up discussed, also refilled omeprazole to take once per day for GERD and discussed trigger avoidance including alcohol..  RTC/ER precautions given if worsening abdominal pain or recurrence of significant blood in stool.  Meds ordered this encounter  Medications  . omeprazole (PRILOSEC) 20 MG capsule    Sig: Take 1 capsule (20 mg total) by mouth daily.    Dispense:  30 capsule    Refill:  1  . mesalamine (LIALDA) 1.2 g EC tablet    Sig: Take 1 tablet (1.2 g total) by mouth daily with breakfast.    Dispense:  30 tablet    Refill:  0   Patient Instructions    Cut back on alcohol for now, and avoid some of the other foods that can worsen heartburn below. Ok to start omeprazole once per day for now as well.  For rectal discomfort, I would like you to meet with  gastroenterology again to see if other testing needed.  Based on previous records and your prior prescription, I am suspicious for inflammatory bowel disease with a current flare.,  Symptoms are possibly slightly improved today, will start with just oral Lialda 1 pill once per day.  If you are not continuing to improve into next week, please return for recheck. Return to the clinic or go to the nearest emergency room if any of your symptoms worsen or new symptoms occur.   Rectal Bleeding Rectal bleeding is when blood passes out of the anus. People with rectal bleeding may notice bright red blood in their underwear or in the toilet after having a bowel movement. They may also have dark red or black stools. Rectal bleeding is usually a sign that something is wrong. Many things can cause rectal bleeding, including:  Hemorrhoids. These are blood vessels in the anus or rectum that are larger than normal.  Fistulas. These are abnormal passages in the rectum and anus.  Anal fissures. This is a tear in the anus.  Diverticulosis. This is a condition in which pockets or sacs project from the bowel.  Proctitis and colitis. These are conditions in which the rectum, colon, or anus become inflamed.  Polyps. These are growths that can be cancerous (malignant) or non-cancerous (benign).  Part of the rectum sticking out from the anus (rectal prolapse).  Certain medicines.  Intestinal infections.  Follow these instructions at home: Pay attention to any changes in your symptoms. Take these actions to help lessen bleeding and discomfort:  Eat a diet that is high in fiber. This will keep your stool soft, making it easier to pass stools without straining. Ask your health care provider what foods and drinks are high in fiber.  Drink enough fluid to keep your urine clear or pale yellow. This also helps to keep your stool soft.  Try taking a warm bath. This may help soothe any pain in your rectum.  Keep all  follow-up visits as told by your health care provider.  This is important.  Get help right away if:  You have new or increased rectal bleeding.  You have black or dark red stools.  You vomit blood or something that looks like coffee grounds.  You have pain or tenderness in your abdomen.  You have a fever.  You feel weak.  You feel nauseous.  You faint.  You have severe pain in your rectum.  You cannot have a bowel movement. This information is not intended to replace advice given to you by your health care provider. Make sure you discuss any questions you have with your health care provider. Document Released: 12/21/2001 Document Revised: 12/07/2015 Document Reviewed: 08/27/2015 Elsevier Interactive Patient Education  2018 [employer].     IF you received an x-ray today, you will receive an invoice from [facility]. Please contact [facility] at [phone] with questions or concerns regarding your invoice.   IF you received labwork today, you will receive an invoice from [place]. Please contact [facility] at [phone] with questions or concerns regarding your invoice.   Our billing staff will not be able to assist you with questions regarding bills from these companies.  You will be contacted with the lab results as soon as they are available. The fastest way to get your results is to activate your My Chart account. Instructions are located on the last page of this paperwork. If you have not heard from [place] regarding the results in 2 weeks, please contact this office.       I personally performed the services described in this documentation, which was scribed in my presence. The recorded information has been reviewed and considered for accuracy and completeness, addended by me as needed, and agree with information above.  Signed,   Merri Ray, MD [facility] [place].  12/26/17 6:31 PM

## 2017-12-27 LAB — SEDIMENTATION RATE: SED RATE: 9 mm/h (ref 0–15)

## 2018-01-06 ENCOUNTER — Encounter: Payer: Self-pay | Admitting: Physician Assistant

## 2018-01-22 ENCOUNTER — Ambulatory Visit (INDEPENDENT_AMBULATORY_CARE_PROVIDER_SITE_OTHER): Payer: 59 | Admitting: Physician Assistant

## 2018-01-22 ENCOUNTER — Encounter: Payer: Self-pay | Admitting: Physician Assistant

## 2018-01-22 VITALS — BP 124/82 | HR 76 | Ht 69.0 in | Wt 169.5 lb

## 2018-01-22 DIAGNOSIS — K51919 Ulcerative colitis, unspecified with unspecified complications: Secondary | ICD-10-CM | POA: Diagnosis not present

## 2018-01-22 DIAGNOSIS — K529 Noninfective gastroenteritis and colitis, unspecified: Secondary | ICD-10-CM

## 2018-01-22 DIAGNOSIS — K6289 Other specified diseases of anus and rectum: Secondary | ICD-10-CM

## 2018-01-22 DIAGNOSIS — K625 Hemorrhage of anus and rectum: Secondary | ICD-10-CM | POA: Diagnosis not present

## 2018-01-22 MED ORDER — MESALAMINE 1.2 G PO TBEC: DELAYED_RELEASE_TABLET | ORAL | 6 refills | Status: DC

## 2018-01-22 NOTE — Progress Notes (Signed)
Subjective:    Patient ID: Dustin Chan, male    DOB: Dec 19, 1970, 47 y.o.   MRN: [id number]  HPI Dustin Chan is a pleasant 47 year old African-American male, new to GI today referred by Janeann Forehand, MD for evaluation of recent rectal bleeding.  Patient has history of GERD, he had a testicular cancer in 2011 which was treated with surgery and developed a postop DVT. He is previously known to Dr. Verl Blalock having last been seen here in 2011.  He had colonoscopy in August 2011 for rectal bleeding and was found to have erythema and mild inflammation in the rectum and sigmoid colon and otherwise normal-appearing bowel.  Biopsies showed chronic active colitis consistent with IBD. He was started on Lialda 2.4 g daily, and then at some point lost to follow-up. He says he does not recall how long he stayed on the Lialda his symptoms must have improved.  He did not have any GI issues until about 2 months ago when he started noticing blood mixed in with his stool and on the tissue.  Describes this as being red or pinkish in color.  Is generally just having 1-2 bowel movements per day, no diarrhea but says he has a lot of gurgling in his abdomen.  He denies any abdominal pain or cramping, appetite has been good weight has been stable.  He denies any rectal pain. Says he is under a fair amount of stress chronically and does have problems with anxiety and thinks perhaps stress has exacerbated his symptoms.  He also says he was getting in the habit of taking 2 or 3 shots of liquor when he comes home from third shift in the morning and this helps him sleep. He was seen by Dr. Shea Stakes prior to being referred here and had labs done on 12/26/2017.  Sed rate was 9, WBC of 5.6 hemoglobin 14.9 hematocrit of 45.1 MCV of 84. He was given a one-month prescription for Lialda, 1.2 g once daily which she has been taking. Patient is also on omeprazole chronically for reflux.  Family history is negative for colon cancer  and IBD as far as he is aware.  His father had prostate cancer and mom had breast cancer.   Review of Systems Pertinent positive and negative review of systems were noted in the above HPI section.  All other review of systems was otherwise negative.  Outpatient Encounter Medications as of 01/22/2018  Medication Sig  . mesalamine (LIALDA) 1.2 g EC tablet Take 4 tablets by mouth daily .  Marland Kitchen omeprazole (PRILOSEC) 20 MG capsule Take 1 capsule (20 mg total) by mouth daily. (Patient taking differently: Take 20 mg by mouth daily as needed. )  . [DISCONTINUED] mesalamine (LIALDA) 1.2 g EC tablet Take 1 tablet (1.2 g total) by mouth daily with breakfast.   No facility-administered encounter medications on file as of 01/22/2018.    No Known Allergies Patient Active Problem List   Diagnosis Date Noted  . HIATAL HERNIA WITH REFLUX 04/05/2010  . INFLAMMATORY BOWEL DISEASE 04/05/2010  . GOUT 02/02/2010  . OTHER SPEC DRUG DEPENDENCE UNSPEC ABUSE 01/12/2010  . IBS 01/12/2010  . RECTAL BLEEDING 01/12/2010  . ABDOMINAL PAIN, LEFT UPPER QUADRANT 01/12/2010   Social History   Socioeconomic History  . Marital status: Married    Spouse name: 3  . Number of children: Not on file  . Years of education: Not on file  . Highest education level: Not on file  Occupational History  .  Occupation: Glass blower/designer  Social Needs  . Financial resource strain: Not on file  . Food insecurity:    Worry: Not on file    Inability: Not on file  . Transportation needs:    Medical: Not on file    Non-medical: Not on file  Tobacco Use  . Smoking status: Current Every Day Smoker    Types: Cigarettes  . Smokeless tobacco: Never Used  Substance and Sexual Activity  . Alcohol use: Yes  . Drug use: Yes  . Sexual activity: Yes    Partners: Female  Lifestyle  . Physical activity:    Days per week: Not on file    Minutes per session: Not on file  . Stress: Not on file  Relationships  . Social connections:     Talks on phone: Not on file    Gets together: Not on file    Attends religious service: Not on file    Active member of club or organization: Not on file    Attends meetings of clubs or organizations: Not on file    Relationship status: Not on file  . Intimate partner violence:    Fear of current or ex partner: Not on file    Emotionally abused: Not on file    Physically abused: Not on file    Forced sexual activity: Not on file  Other Topics Concern  . Not on file  Social History Narrative  . Not on file    Dustin Chan's family history includes Breast cancer in his mother; Heart disease in his maternal grandfather and maternal grandmother; Prostate cancer in his father.      Objective:    Vitals:   01/22/18 1358  BP: 124/82  Pulse: 76    Physical Exam; well-developed African-American male in no acute distress, pleasant blood pressure 124/82 pulse 76, height 5 foot 9, weight 169, BMI 25.0.  HEENT; nontraumatic normocephalic EOMI PERRLA sclera anicteric oropharynx benign, Cardiovascular; regular rate and rhythm with S1-S2 no murmur rub or gallop, Pulmonary; clear bilaterally, Abdomen; soft, basically nontender there is no palpable mass or hepatosplenomegaly bowel sounds are active, Rectal ;exam not done, Extremities; no clubbing cyanosis or edema and warm dry, Neuro psych ;alert and oriented, grossly nonfocal mood and affect appropriate       Assessment & Plan:   #102 47 year old African-American male with 13-month history of rectal bleeding with small volume bright red blood noted in the stool and on the tissue. Patient has prior history of chronic active colitis made at the time of colonoscopy in August 2011.  He had not been on any treatment for colitis since 2011. It is unclear at this time whether his rectal bleeding is secondary to activation of underlying ulcerative colitis or secondary to occult lesion, or internal hemorrhoids.  #2 GERD stable on omeprazole 20 mg  daily #3 history of testicular cancer #4 history of postop DVT  Plan; Will increase Lialda to 4.8 g daily short-term. Patient will be scheduled for colonoscopy with Dr. Loletha Carrow.  Procedure was discussed in detail with the patient including indications risks and benefits and he is agreeable to proceed. Further plans pending results of colonoscopy.  Dustin Chan S Kaycen Whitworth PA-C 01/22/2018   Cc: Wendie Agreste, MD

## 2018-01-22 NOTE — Patient Instructions (Addendum)
  If you are age 45 or younger, your body mass index should be between 19-25. Your Body mass index is 25.03 kg/m. If this is out of the aformentioned range listed, please consider follow up with your Primary Care Provider.   We sent refills for Lialda 1.2 g to your pharmacy.  You have been scheduled for a colonoscopy. Please follow written instructions given to you at your visit today.  We have provided you with the Prep kit. You will not have to buy it. If you use inhalers (even only as needed), please bring them with you on the day of your procedure. Your physician has requested that you go to www.startemmi.com and enter the access code given to you at your visit today. This web site gives a general overview about your procedure. However, you should still follow specific instructions given to you by our office regarding your preparation for the procedure.

## 2018-01-23 NOTE — Progress Notes (Signed)
Thank you for sending this case to me. I have reviewed the entire note, and the outlined plan seems appropriate.   Henry Danis, MD  

## 2018-02-19 ENCOUNTER — Encounter: Payer: Self-pay | Admitting: Gastroenterology

## 2018-02-19 ENCOUNTER — Ambulatory Visit (AMBULATORY_SURGERY_CENTER): Payer: 59 | Admitting: Gastroenterology

## 2018-02-19 VITALS — BP 128/76 | HR 62 | Temp 98.6°F | Resp 8 | Ht 69.0 in | Wt 169.0 lb

## 2018-02-19 DIAGNOSIS — K625 Hemorrhage of anus and rectum: Secondary | ICD-10-CM

## 2018-02-19 DIAGNOSIS — K629 Disease of anus and rectum, unspecified: Secondary | ICD-10-CM

## 2018-02-19 DIAGNOSIS — K599 Functional intestinal disorder, unspecified: Secondary | ICD-10-CM | POA: Diagnosis not present

## 2018-02-19 MED ORDER — SODIUM CHLORIDE 0.9 % IV SOLN: 500.0000 mL | Freq: Once | INTRAVENOUS | Status: DC

## 2018-02-19 NOTE — Op Note (Signed)
[facility] Patient Name: Dustin Chan Procedure Date: 02/19/2018 4:06 PM MRN: [id number] Endoscopist: Mallie Mussel L. Loletha Carrow , MD Age: 47 Referring MD:  Date of Birth: 1971/01/26 Gender: Male Account #: [id number] Procedure:                Colonoscopy Indications:              Rectal bleeding (recently recurred), Chronic                            ulcerative proctosigmoiditis (diagnosed 2011, was                            on mesalamine, no therapy for last several years                            until recently resumed) Medicines:                Monitored Anesthesia Care Procedure:                Pre-Anesthesia Assessment:                           - Prior to the procedure, a History and Physical                            was performed, and patient medications and                            allergies were reviewed. The patient's tolerance of                            previous anesthesia was also reviewed. The risks                            and benefits of the procedure and the sedation                            options and risks were discussed with the patient.                            All questions were answered, and informed consent                            was obtained. Prior Anticoagulants: The patient has                            taken no previous anticoagulant or antiplatelet                            agents. ASA Grade Assessment: II - A patient with                            mild systemic disease. After reviewing the risks  and benefits, the patient was deemed in                            satisfactory condition to undergo the procedure.                           After obtaining informed consent, the colonoscope                            was passed under direct vision. Throughout the                            procedure, the patient's blood pressure, pulse, and                            oxygen saturations were monitored  continuously. The                            Colonoscope was introduced through the anus and                            advanced to the the cecum, identified by                            appendiceal orifice and ileocecal valve. The                            colonoscopy was performed without difficulty. The                            patient tolerated the procedure well. The quality                            of the bowel preparation was excellent. The                            ileocecal valve, appendiceal orifice, and rectum                            were photographed. The quality of the bowel                            preparation was evaluated using the BBPS [facility]                            Bowel Preparation Scale) with scores of: Right                            Colon = 3, Transverse Colon = 3 and Left Colon = 3                            (entire mucosa seen well with no residual staining,  small fragments of stool or opaque liquid). The                            total BBPS score equals 9. Scope In: 4:11:56 PM Scope Out: 4:25:38 PM Scope Withdrawal Time: 0 hours 11 minutes 7 seconds  Total Procedure Duration: 0 hours 13 minutes 42 seconds  Findings:                 The perianal and digital rectal examinations were                            normal.                           Patchy mild inflammation characterized by                            congestion (edema) and erythema was found in the                            rectum. Biopsies were taken with a cold forceps for                            histology.                           The exam was otherwise without abnormality on                            direct and retroflexion views. Additional biopsies                            taken of ascending, transverse and sigmoid colon to                            rule out more proximal microscopic IBD activity. Complications:            No immediate  complications. Estimated Blood Loss:     Estimated blood loss was minimal. Impression:               - Patchy mild inflammation was found in the rectum.                            Biopsied.                           - The examination was otherwise normal on direct                            and retroflexion views. Recommendation:           - Patient has a contact number available for                            emergencies. The signs and symptoms of potential  delayed complications were discussed with the                            patient. Return to normal activities tomorrow.                            Written discharge instructions were provided to the                            patient.                           - Resume previous diet.                           - Continue present medications.                           - Await pathology results, then schedule office                            follow-up.                           - Repeat colonoscopy PRN to evaluate the response                            to therapy. Hamsa Laurich L. Loletha Carrow, MD 02/19/2018 4:43:08 PM This report has been signed electronically.

## 2018-02-19 NOTE — Progress Notes (Signed)
Report to PACU, RN, vss, BBS= Clear.  

## 2018-02-19 NOTE — Patient Instructions (Signed)
YOU HAD AN ENDOSCOPIC PROCEDURE TODAY AT THE [facility]:   Refer to the procedure report that was given to you for any specific questions about what was found during the examination.  If the procedure report does not answer your questions, please call your gastroenterologist to clarify.  If you requested that your care partner not be given the details of your procedure findings, then the procedure report has been included in a sealed envelope for you to review at your convenience later.  YOU SHOULD EXPECT: Some feelings of bloating in the abdomen. Passage of more gas than usual.  Walking can help get rid of the air that was put into your GI tract during the procedure and reduce the bloating. If you had a lower endoscopy (such as a colonoscopy or flexible sigmoidoscopy) you may notice spotting of blood in your stool or on the toilet paper. If you underwent a bowel prep for your procedure, you may not have a normal bowel movement for a few days.  Please Note:  You might notice some irritation and congestion in your nose or some drainage.  This is from the oxygen used during your procedure.  There is no need for concern and it should clear up in a day or so.  SYMPTOMS TO REPORT IMMEDIATELY:   Following lower endoscopy (colonoscopy or flexible sigmoidoscopy):  Excessive amounts of blood in the stool  Significant tenderness or worsening of abdominal pains  Swelling of the abdomen that is new, acute  Fever of 100F or higher  For urgent or emergent issues, a gastroenterologist can be reached at any hour by calling [phone].   DIET:  We do recommend a small meal at first, but then you may proceed to your regular diet.  Drink plenty of fluids but you should avoid alcoholic beverages for 24 hours.  MEDICATIONS: Continue present medications.  Please see handouts given to you by your recovery nurse.  ACTIVITY:  You should plan to take it easy for the rest of today and you should NOT  DRIVE or use heavy machinery until tomorrow (because of the sedation medicines used during the test).    FOLLOW UP: Our staff will call the number listed on your records the next business day following your procedure to check on you and address any questions or concerns that you may have regarding the information given to you following your procedure. If we do not reach you, we will leave a message.  However, if you are feeling well and you are not experiencing any problems, there is no need to return our call.  We will assume that you have returned to your regular daily activities without incident.  If any biopsies were taken you will be contacted by phone or by letter within the next 1-3 weeks.  Please call us at [phone] if you have not heard about the biopsies in 3 weeks.   Thank you for allowing us to provide for your healthcare needs today.   SIGNATURES/CONFIDENTIALITY: You and/or your care partner have signed paperwork which will be entered into your electronic medical record.  These signatures attest to the fact that that the information above on your After Visit Summary has been reviewed and is understood.  Full responsibility of the confidentiality of this discharge information lies with you and/or your care-partner. 

## 2018-02-19 NOTE — Progress Notes (Signed)
Called to room to assist during endoscopic procedure.  Patient ID and intended procedure confirmed with present staff. Received instructions for my participation in the procedure from the performing physician.  

## 2018-02-20 ENCOUNTER — Telehealth: Payer: Self-pay

## 2018-02-20 NOTE — Telephone Encounter (Signed)
  Follow up Call-  Call back number 02/19/2018  Post procedure Call Back phone  # [phone]  Permission to leave phone message Yes  Some recent data might be hidden     Patient questions:  Do you have a fever, pain , or abdominal swelling? No. Pain Score  0 *  Have you tolerated food without any problems? Yes.    Have you been able to return to your normal activities? Yes.    Do you have any questions about your discharge instructions: Diet   No. Medications  No. Follow up visit  No.  Do you have questions or concerns about your Care? No.  Actions: * If pain score is 4 or above: No action needed, pain <4.

## 2018-02-27 ENCOUNTER — Telehealth: Payer: Self-pay | Admitting: Gastroenterology

## 2018-02-27 NOTE — Telephone Encounter (Signed)
Patient states he is returning Julie's call about results from procedure on 8.8.19.

## 2018-02-27 NOTE — Telephone Encounter (Signed)
See result note.  

## 2018-04-03 ENCOUNTER — Encounter (INDEPENDENT_AMBULATORY_CARE_PROVIDER_SITE_OTHER): Payer: Self-pay

## 2018-04-03 ENCOUNTER — Ambulatory Visit (INDEPENDENT_AMBULATORY_CARE_PROVIDER_SITE_OTHER): Payer: 59 | Admitting: Gastroenterology

## 2018-04-03 ENCOUNTER — Encounter: Payer: Self-pay | Admitting: Gastroenterology

## 2018-04-03 VITALS — BP 122/68 | HR 65 | Ht 69.0 in | Wt 170.0 lb

## 2018-04-03 DIAGNOSIS — K625 Hemorrhage of anus and rectum: Secondary | ICD-10-CM

## 2018-04-03 DIAGNOSIS — K5909 Other constipation: Secondary | ICD-10-CM | POA: Diagnosis not present

## 2018-04-03 DIAGNOSIS — K219 Gastro-esophageal reflux disease without esophagitis: Secondary | ICD-10-CM | POA: Diagnosis not present

## 2018-04-03 MED ORDER — OMEPRAZOLE 20 MG PO CPDR: 20.0000 mg | DELAYED_RELEASE_CAPSULE | Freq: Every day | ORAL | 1 refills | Status: DC

## 2018-04-03 NOTE — Progress Notes (Signed)
     Dustin Chan GI Progress Note  Chief Complaint: Ulcerative colitis  Subjective  History:  Dustin Chan follows up for his ulcerative colitis.  He had been diagnosed by Dr. Sharlett Chan in 2011, at which time he had proctosigmoiditis.  He had been off therapy many years, reestablish care with our clinic 2 months ago for recurrence of diarrhea and rectal bleeding. Colonoscopy 02/19/2018 (with the patient having been back on Lialda by then) only showed mild patchy rectal erythema. Biopsies from the ascending, transverse, sigmoid colon and rectum showed no chronic inflammation or other abnormalities.(Biopsies in August 2011 by Dr. Boykin Chan showed typical changes of chronic colitis/IBD)  Dustin Chan describes his bowel habits leading up to the July visit as being small and less frequent with some straining and then bleeding.  He was not having loose stool.  He has been cutting back alcohol, increasing fiber and water intake and noticed an improvement in his bowel habits.  He still has some occasional blood with wiping.  He has chronic heartburn which improved considerably on once daily omeprazole.  He denies dysphagia or odynophagia, nausea vomiting or weight loss.  ROS: Cardiovascular:  no chest pain Respiratory: no dyspnea  The patient's Past Medical, Family and Social History were reviewed and are on file in the EMR.  Objective:  Med list reviewed  Current Outpatient Medications:  .  omeprazole (PRILOSEC) 20 MG capsule, Take 1 capsule (20 mg total) by mouth daily., Disp: 90 capsule, Rfl: 1  Current Facility-Administered Medications:  .  0.9 %  sodium chloride infusion, 500 mL, Intravenous, Once, Dustin Chan, Dustin Cotta III, MD  [name]  Vital signs in last 24 hrs: Vitals:   04/03/18 1059  BP: 122/68  Pulse: 65    Physical Exam  He is well-appearing, normal vocal quality, good muscle mass.  Accompanied by his wife.   HEENT: sclera anicteric, oral mucosa moist  without lesions  Neck: supple, no thyromegaly, JVD or lymphadenopathy  Cardiac: RRR without murmurs, S1S2 heard, no peripheral edema  Pulm: clear to auscultation bilaterally, normal RR and effort noted  Abdomen: soft, no tenderness, with active bowel sounds. No guarding or palpable hepatosplenomegaly.  Skin; warm and dry, no jaundice or rash  Recent Labs:  Biopsies as noted above    @ASSESSMENTPLANBEGIN @ Assessment: Encounter Diagnoses  Name Primary?  . Rectal bleeding Yes  . Other constipation   . Gastroesophageal reflux disease, esophagitis presence not specified    He does not appear to have active inflammatory bowel disease, even accounting for the fact that he had been back on Lialda for short period of time prior to the colonoscopy.  Even in that case, I would have expected to see microscopic evidence of chronic colitis.  It now seems more likely that he was having constipation with benign anal bleeding related to that.  [name]  He continues to have reflux, which we also discussed and needs both medicines and diet and lifestyle changes.   Plan: I have advised him not to resume the [place]. Adequate dietary fiber, water, exercise and maintain regularity. No enlarged prolapsing hemorrhoids were seen on colonoscopy, banding does not appear necessary. Omeprazole 20 mg once daily prescribed.  [name]   Total time 20 minutes, over half spent face-to-face with patient in counseling and coordination of care.   Dustin Chan

## 2018-04-03 NOTE — Patient Instructions (Signed)
If you are age 45 or older, your body mass index should be between 23-30. Your Body mass index is 25.1 kg/m. If this is out of the aforementioned range listed, please consider follow up with your Primary Care Provider.  If you are age 26 or younger, your body mass index should be between 19-25. Your Body mass index is 25.1 kg/m. If this is out of the aformentioned range listed, please consider follow up with your Primary Care Provider.   Follow up as needed.  It was a pleasure to see you today!  Dr. Loletha Carrow   Gastroesophageal Reflux Disease, Adult Normally, food travels down the esophagus and stays in the stomach to be digested. If a person has gastroesophageal reflux disease (GERD), food and stomach acid move back up into the esophagus. When this happens, the esophagus becomes sore and swollen (inflamed). Over time, GERD can make small holes (ulcers) in the lining of the esophagus. Follow these instructions at home: Diet  Follow a diet as told by your doctor. You may need to avoid foods and drinks such as: ? Coffee and tea (with or without caffeine). ? Drinks that contain alcohol. ? Energy drinks and sports drinks. ? Carbonated drinks or sodas. ? Chocolate and cocoa. ? Peppermint and mint flavorings. ? Garlic and onions. ? Horseradish. ? Spicy and acidic foods, such as peppers, chili powder, curry powder, vinegar, hot sauces, and BBQ sauce. ? Citrus fruit juices and citrus fruits, such as oranges, lemons, and limes. ? Tomato-based foods, such as red sauce, chili, salsa, and pizza with red sauce. ? Fried and fatty foods, such as donuts, french fries, potato chips, and high-fat dressings. ? High-fat meats, such as hot dogs, rib eye steak, sausage, ham, and bacon. ? High-fat dairy items, such as whole milk, butter, and cream cheese.  Eat small meals often. Avoid eating large meals.  Avoid drinking large amounts of liquid with your meals.  Avoid eating meals during the 2-3 hours  before bedtime.  Avoid lying down right after you eat.  Do not exercise right after you eat. General instructions  Pay attention to any changes in your symptoms.  Take over-the-counter and prescription medicines only as told by your doctor. Do not take aspirin, ibuprofen, or other NSAIDs unless your doctor says it is okay.  Do not use any tobacco products, including cigarettes, chewing tobacco, and e-cigarettes. If you need help quitting, ask your doctor.  Wear loose clothes. Do not wear anything tight around your waist.  Raise (elevate) the head of your bed about 6 inches (15 cm).  Try to lower your stress. If you need help doing this, ask your doctor.  If you are overweight, lose an amount of weight that is healthy for you. Ask your doctor about a safe weight loss goal.  Keep all follow-up visits as told by your doctor. This is important. Contact a doctor if:  You have new symptoms.  You lose weight and you do not know why it is happening.  You have trouble swallowing, or it hurts to swallow.  You have wheezing or a cough that keeps happening.  Your symptoms do not get better with treatment.  You have a hoarse voice. Get help right away if:  You have pain in your arms, neck, jaw, teeth, or back.  You feel sweaty, dizzy, or light-headed.  You have chest pain or shortness of breath.  You throw up (vomit) and your throw up looks like blood or coffee grounds.  You  pass out (faint).  Your poop (stool) is bloody or black.  You cannot swallow, drink, or eat. This information is not intended to replace advice given to you by your health care provider. Make sure you discuss any questions you have with your health care provider. Document Released: 12/18/2007 Document Revised: 12/07/2015 Document Reviewed: 10/26/2014 Elsevier Interactive Patient Education  2018 [place] for Gastroesophageal Reflux Disease, Adult When you have gastroesophageal reflux  disease (GERD), the foods you eat and your eating habits are very important. Choosing the right foods can help ease your discomfort. What guidelines do I need to follow?  Choose fruits, vegetables, whole grains, and low-fat dairy products.  Choose low-fat meat, fish, and poultry.  Limit fats such as oils, salad dressings, butter, nuts, and avocado.  Keep a food diary. This helps you identify foods that cause symptoms.  Avoid foods that cause symptoms. These may be different for everyone.  Eat small meals often instead of 3 large meals a day.  Eat your meals slowly, in a place where you are relaxed.  Limit fried foods.  Cook foods using methods other than frying.  Avoid drinking alcohol.  Avoid drinking large amounts of liquids with your meals.  Avoid bending over or lying down until 2-3 hours after eating. What foods are not recommended? These are some foods and drinks that may make your symptoms worse: Vegetables Tomatoes. Tomato juice. Tomato and spaghetti sauce. Chili peppers. Onion and garlic. Horseradish. Fruits Oranges, grapefruit, and lemon (fruit and juice). Meats High-fat meats, fish, and poultry. This includes hot dogs, ribs, ham, sausage, salami, and bacon. Dairy Whole milk and chocolate milk. Sour cream. Cream. Butter. Ice cream. Cream cheese. Drinks Coffee and tea. Bubbly (carbonated) drinks or energy drinks. Condiments Hot sauce. Barbecue sauce. Sweets/Desserts Chocolate and cocoa. Donuts. Peppermint and spearmint. Fats and Oils High-fat foods. This includes [place] fries and potato chips. Other Vinegar. Strong spices. This includes black pepper, white pepper, red pepper, cayenne, curry powder, cloves, ginger, and chili powder. The items listed above may not be a complete list of foods and drinks to avoid. Contact your dietitian for more information. This information is not intended to replace advice given to you by your health care provider. Make sure you  discuss any questions you have with your health care provider. Document Released: 12/31/2011 Document Revised: 12/07/2015 Document Reviewed: 05/05/2013 Elsevier Interactive Patient Education  2017 [employer].

## 2021-09-21 ENCOUNTER — Encounter: Payer: Self-pay | Admitting: Family Medicine

## 2021-09-21 ENCOUNTER — Other Ambulatory Visit: Payer: Self-pay

## 2021-09-21 ENCOUNTER — Ambulatory Visit (INDEPENDENT_AMBULATORY_CARE_PROVIDER_SITE_OTHER): Payer: 59 | Admitting: Family Medicine

## 2021-09-21 VITALS — BP 112/74 | HR 59 | Temp 98.6°F | Ht 69.5 in | Wt 171.4 lb

## 2021-09-21 DIAGNOSIS — Z86718 Personal history of other venous thrombosis and embolism: Secondary | ICD-10-CM | POA: Insufficient documentation

## 2021-09-21 DIAGNOSIS — K5190 Ulcerative colitis, unspecified, without complications: Secondary | ICD-10-CM | POA: Insufficient documentation

## 2021-09-21 DIAGNOSIS — Z1159 Encounter for screening for other viral diseases: Secondary | ICD-10-CM

## 2021-09-21 DIAGNOSIS — Z23 Encounter for immunization: Secondary | ICD-10-CM | POA: Diagnosis not present

## 2021-09-21 DIAGNOSIS — Z13220 Encounter for screening for lipoid disorders: Secondary | ICD-10-CM | POA: Diagnosis not present

## 2021-09-21 DIAGNOSIS — Z8547 Personal history of malignant neoplasm of testis: Secondary | ICD-10-CM | POA: Insufficient documentation

## 2021-09-21 DIAGNOSIS — K625 Hemorrhage of anus and rectum: Secondary | ICD-10-CM | POA: Diagnosis not present

## 2021-09-21 DIAGNOSIS — K51319 Ulcerative (chronic) rectosigmoiditis with unspecified complications: Secondary | ICD-10-CM

## 2021-09-21 DIAGNOSIS — F1210 Cannabis abuse, uncomplicated: Secondary | ICD-10-CM

## 2021-09-21 DIAGNOSIS — Z114 Encounter for screening for human immunodeficiency virus [HIV]: Secondary | ICD-10-CM

## 2021-09-21 DIAGNOSIS — K219 Gastro-esophageal reflux disease without esophagitis: Secondary | ICD-10-CM | POA: Insufficient documentation

## 2021-09-21 LAB — CBC
HCT: 40.3 % (ref 39.0–52.0)
Hemoglobin: 13.5 g/dL (ref 13.0–17.0)
MCHC: 33.5 g/dL (ref 30.0–36.0)
MCV: 85 fl (ref 78.0–100.0)
Platelets: 197 10*3/uL (ref 150.0–400.0)
RBC: 4.74 Mil/uL (ref 4.22–5.81)
RDW: 13.5 % (ref 11.5–15.5)
WBC: 5.9 10*3/uL (ref 4.0–10.5)

## 2021-09-21 LAB — COMPREHENSIVE METABOLIC PANEL
ALT: 18 U/L (ref 0–53)
AST: 22 U/L (ref 0–37)
Albumin: 4.4 g/dL (ref 3.5–5.2)
Alkaline Phosphatase: 47 U/L (ref 39–117)
BUN: 15 mg/dL (ref 6–23)
CO2: 30 mEq/L (ref 19–32)
Calcium: 9.6 mg/dL (ref 8.4–10.5)
Chloride: 104 mEq/L (ref 96–112)
Creatinine, Ser: 1.13 mg/dL (ref 0.40–1.50)
GFR: 76.55 mL/min (ref >60.00)
Glucose, Bld: 78 mg/dL (ref 70–99)
Potassium: 3.9 mEq/L (ref 3.5–5.1)
Sodium: 139 mEq/L (ref 135–145)
Total Bilirubin: 0.5 mg/dL (ref 0.2–1.2)
Total Protein: 7.3 g/dL (ref 6.0–8.3)

## 2021-09-21 LAB — LIPID PANEL
Cholesterol: 188 mg/dL (ref 0–200)
HDL: 70.5 mg/dL (ref >39.00)
LDL Cholesterol: 104 mg/dL — ABNORMAL HIGH (ref 0–99)
NonHDL: 117.13
Total CHOL/HDL Ratio: 3
Triglycerides: 67 mg/dL (ref 0.0–149.0)
VLDL: 13.4 mg/dL (ref 0.0–40.0)

## 2021-09-21 NOTE — Progress Notes (Signed)
?[facility] ?[facility] ?[place] ?[place] [address] ?Dept: [phone] ?Dept Fax: [phone] ? ?New Patient Office Visit ? ?Subjective:  ? ? Patient ID: Dustin Chan, male    DOB: [birth year], 51 y.o..   MRN: [id number] ? ?Chief Complaint  ?Patient presents with  ? Establish Care  ?  NP-establish care.  C/o having pain in the LT side abdomen.  Has been seeing blood in stool daily, seen GI and had a colonoscopy.    ? ? ?History of Present Illness: ? ?Patient is in today to establish care. Dustin Chan was born in [place], [place]. His family moved to [place], [place] when he was young. He attended [employer] for a couple fo years, studying nursing, but did not complete his degree. He currently works as a Glass blower/designer for [employer]. Dustin Chan drinks alcohol occasionally. He smokes marijuana daily, up to six joints a day. He notes he uses a tobacco leaf wrapper for this. ? ?Dustin Chan has a history of testicular cancer. He notes this occurred in 2011. He developed an acute right DVT and found swollen lymph nodes in his groin. He states he underwent a left orchiectomy. He was offered radiation/chemotherapy, but saw an oncologist in a different health system who performed a PET scan and found no evidence of metastasis. As such, he did not have/need further treatment. However, he has not seen any doctor in the past 4 years and has had no surveillance performed. He was treated for a period of time with coumadin for his DVT, but this was eventually stopped, as his DVT was felt to be provoked by his cancer. ? 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 He was being treated with mesalamine, but stopped this several years ago. He had seen Dr. Loletha Carrow (gastroenterology) in the past, but has not been back since 2019. He notes he has had ongoing intermittent rectal bleeding since that time. He believes that this is due to  an internal hemorrhoid. ? ?Past Medical History: ?Patient Active Problem List  ? Diagnosis Date Noted  ? Ulcerative colitis ([place]) 09/21/2021  ? GERD (gastroesophageal reflux disease) 09/21/2021  ? History of testicular cancer 09/21/2021  ? History of DVT (deep vein thrombosis), provoked 09/21/2021  ? Hiatal hernia 04/05/2010  ? Gout 02/02/2010  ? Marijuana abuse, continuous 01/12/2010  ? Rectal bleeding 01/12/2010  ? ?Past Surgical History:  ?Procedure Laterality Date  ? COLONOSCOPY    ? ORCHIECTOMY Left   ? ?Family History  ?Problem Relation Age of Onset  ? Cancer Mother   ?     Breast  ? COPD Father   ? Cancer Father   ?     Prostate  ? Heart disease Maternal Grandmother   ? Heart disease Maternal Grandfather   ? ?Outpatient Medications Prior to Visit  ?Medication Sig Dispense Refill  ? Multiple Vitamin (MULTIVITAMIN) tablet Take 1 tablet by mouth daily.    ? omeprazole (PRILOSEC) 20 MG capsule Take 1 capsule (20 mg total) by mouth daily. 90 capsule 1  ? 0.9 %  sodium chloride infusion     ? ?No facility-administered medications prior to visit.  ? ?No Known Allergies ?   ?Objective:  ? ?Today's Vitals  ? 09/21/21 0938  ?BP: 112/74  ?Pulse: (!) 59  ?Temp: 98.6 ?F (37 ?C)  ?TempSrc: Temporal  ?Weight: 171 lb 6.4 oz (77.7 kg)  ?Height: 5' 9.5" (1.765 m)  ? ?Body mass index is  24.95 kg/m?.  ? ?General: Well developed, well nourished. No acute distress. ?Psych: Alert and oriented. Normal mood and affect. ? ?Health Maintenance Due  ?Topic Date Due  ? Hepatitis C Screening  Never done  ? TETANUS/TDAP  Never done  ? COVID-19 Vaccine (3 - Booster for Pfizer series) 07/02/2020  ?   ?Assessment & Plan:  ? ?1. 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 He also likely needs ot be back on mesalamine for control of the chronic inflammation. I will check a CBC to make sure he is not anemic and screen for other  potential complications. I will refer him back to GI to reestablish care and discuss treatment options. ? ?- CBC ?- Comprehensive metabolic panel ?- Ambulatory referral to Gastroenterology ? ?2. Rectal bleeding ?When Dustin Chan sees GI , he may need a colonoscopy for ongoing monitoring. ? ?- CBC ?- Iron, TIBC and Ferritin Panel ?- Ambulatory referral to Gastroenterology ? ?3. Marijuana abuse, continuous ?I cautioned Dustin Chan about cancer risks associated with smoking marijuana. I would advise him to stop this practice, either stopping THC use or using a different delivery system. ? ?4. History of testicular cancer ?Dustin Chan has not had any surveillance for his testicular cancer in some years. I will refer him to urology to determine if he needs any screenign for tumor markers. ? ?- Ambulatory referral to Urology ? ?5. Encounter for hepatitis C screening test for low risk patient ? ?- HCV Ab w Reflex to Quant PCR ? ?6. Screening for HIV (human immunodeficiency virus) ? ?- HIV Antibody (routine testing w rflx) ? ?7. Screening for lipid disorders ? ?- Lipid panel ? ? ?Return in about 2 weeks (around 10/05/2021) for Evaluate leg and side pain.  ? ?Haydee Salter, MD ?

## 2021-09-22 LAB — HCV AB W REFLEX TO QUANT PCR: HCV Ab: NONREACTIVE

## 2021-09-22 LAB — HCV INTERPRETATION

## 2021-09-24 LAB — IRON,TIBC AND FERRITIN PANEL
%SAT: 20 % (calc) (ref 20–48)
Ferritin: 46 ng/mL (ref 38–380)
Iron: 68 ug/dL (ref 50–180)
TIBC: 335 mcg/dL (calc) (ref 250–425)

## 2021-09-24 LAB — HIV ANTIBODY (ROUTINE TESTING W REFLEX): HIV 1&2 Ab, 4th Generation: NONREACTIVE

## 2021-10-12 ENCOUNTER — Encounter: Payer: Self-pay | Admitting: Gastroenterology

## 2021-11-02 ENCOUNTER — Encounter: Payer: Self-pay | Admitting: Gastroenterology

## 2021-11-02 ENCOUNTER — Ambulatory Visit (INDEPENDENT_AMBULATORY_CARE_PROVIDER_SITE_OTHER): Payer: 59 | Admitting: Gastroenterology

## 2021-11-02 VITALS — BP 124/90 | HR 69 | Ht 68.25 in | Wt 171.4 lb

## 2021-11-02 DIAGNOSIS — K648 Other hemorrhoids: Secondary | ICD-10-CM

## 2021-11-02 NOTE — Patient Instructions (Signed)
If you are age 49 or older, your body mass index should be between 23-30. Your Body mass index is 25.87 kg/m?Marland Kitchen If this is out of the aforementioned range listed, please consider follow up with your Primary Care Provider. ? ?If you are age 87 or younger, your body mass index should be between 19-25. Your Body mass index is 25.87 kg/m?Marland Kitchen If this is out of the aformentioned range listed, please consider follow up with your Primary Care Provider.  ? ?________________________________________________________ ? ?The [facility] providers would like to encourage you to use [facility] to communicate with providers for non-urgent requests or questions.  Due to long hold times on the telephone, sending your provider a message by [facility] may be a faster and more efficient way to get a response.  Please allow 48 business hours for a response.  Please remember that this is for non-urgent requests.  ?_______________________________________________________ ? ?It has been recommended to you by your physician that you have a hemorrhoid banding completed. Per your request, we did not schedule the procedure(s) today. Please contact our office at [phone] should you decide to have the procedure completed. You will be scheduled for a pre-visit and procedure at that time. ? ?It was a pleasure to see you today! ? ?Thank you for trusting me with your gastrointestinal care!   ? ? ? ? ?

## 2021-11-02 NOTE — Progress Notes (Signed)
? ? ?[facility] Consult Note: ? ?History: ?Dustin Chan ?11/02/2021 ? ?Referring provider: Haydee Salter, MD ? ?Reason for consult/chief complaint: ulcerative rectosigmoiditis (Recent PCP visit) and Rectal Bleeding (When he has a BM, BRB on the paper and in the toilet, pt ? Hemorrhoid ) ? ? ?Subjective  ?HPI: ?From my last office note September 2019: ?"Dustin Chan follows up for his ulcerative colitis.  He had been diagnosed by Dr. Sharlett Iles in 2011, at which time he had proctosigmoiditis.  He had been off therapy many years, reestablish care with our clinic 2 months ago for recurrence of diarrhea and rectal bleeding. ?Colonoscopy 02/19/2018 (with the patient having been back on Lialda by then) only showed mild patchy rectal erythema. ?Biopsies from the ascending, transverse, sigmoid colon and rectum showed no chronic inflammation or other abnormalities.(Biopsies in August 2011 by Dr. Boykin Reaper showed typical changes of chronic colitis/IBD) ?  ?Dustin Chan describes his bowel habits leading up to the July visit as being small and less frequent with some straining and then bleeding.  He was not having loose stool.  He has been cutting back alcohol, increasing fiber and water intake and noticed an improvement in his bowel habits.  He still has some occasional blood with wiping. ?  ?He has chronic heartburn which improved considerably on once daily omeprazole.  He denies dysphagia or odynophagia, nausea vomiting or weight loss ? ?____ ? ?He does not appear to have active inflammatory bowel disease, even accounting for the fact that he had been back on Lialda for short period of time prior to the colonoscopy.  Even in that case, I would have expected to see microscopic evidence of chronic colitis.  It now seems more likely that he was having constipation with benign anal bleeding related to that.  [name]  He continues to have reflux, which we also discussed  and needs both medicines and diet and lifestyle changes. ?  ?  ?Plan: ?I have advised him not to resume the [place]. ?Adequate dietary fiber, water, exercise and maintain regularity. ?No enlarged prolapsing hemorrhoids were seen on colonoscopy, banding does not appear necessary. ?Omeprazole 20 mg once daily prescribed. ?-------------------------------------------------------------------------------------- ?[name] reestablish care with a new PCP last month, and a comprehensive note was reviewed.  It describes recurrence of painless rectal bleeding thought possibly to be hemorrhoidal in nature, with a history of colitis was noted and referral sent to [place]. ? ?[name] reports episodic painless rectal bleeding either on the paper or in the toilet. ?He denies abdominal pain, but he may feel a pressure or protrusion in the anal area with bowel movements, especially when bleeding occurs. ? ? ?ROS: ? ?Review of Systems  ?Constitutional:  Negative for appetite change and unexpected weight change.  ?HENT:  Negative for mouth sores and voice change.   ?Eyes:  Negative for pain and redness.  ?Respiratory:  Negative for cough and shortness of breath.   ?Cardiovascular:  Negative for chest pain and palpitations.  ?Genitourinary:  Negative for dysuria and hematuria.  ?Musculoskeletal:  Negative for arthralgias and myalgias.  ?Skin:  Negative for pallor and rash.  ?Neurological:  Negative for weakness and headaches.  ?Hematological:  Negative for adenopathy.  ? ? ?Past Medical History: ?Past Medical History:  ?Diagnosis Date  ? Allergy   ? Anxiety   ? Deep vein blood clot of right lower extremity ([place])   ? Substance abuse ([place])   ? marijuana  ? Testicular cancer ([place])   ? ? ? ?  Past Surgical History: ?Past Surgical History:  ?Procedure Laterality Date  ? COLONOSCOPY    ? TESTICLE REMOVAL Left   ? ? ? ?Family History: ?Family History  ?Problem Relation Age of Onset  ? Breast cancer Mother   ? COPD Father   ? Prostate cancer Father   ?  Heart disease Maternal Grandmother   ? Heart disease Maternal Grandfather   ? ? ?Social History: ?Social History  ? ?Socioeconomic History  ? Marital status: Married  ?  Spouse name: Not on file  ? Number of children: 3  ? Years of education: Not on file  ? Highest education level: Some college, no degree  ?Occupational History  ? Occupation: Glass blower/designer  ?  Comment: [employer]  ?Tobacco Use  ? Smoking status: Former  ?  Types: Cigarettes  ?  Quit date: 2011  ?  Years since quitting: 12.3  ? Smokeless tobacco: Never  ?Vaping Use  ? Vaping Use: Never used  ?Substance and Sexual Activity  ? Alcohol use: Yes  ?  Comment: socially 2 beers on the weekend  ? Drug use: Yes  ?  Frequency: 42.0 times per week  ?  Types: Marijuana  ? Sexual activity: Yes  ?  Partners: Female  ?Other Topics Concern  ? Not on file  ?Social History Narrative  ? Not on file  ? ?Social Determinants of Health  ? ?Financial Resource Strain: Not on file  ?Food Insecurity: Not on file  ?Transportation Needs: Not on file  ?Physical Activity: Not on file  ?Stress: Not on file  ?Social Connections: Not on file  ? ? ?Allergies: ?No Known Allergies ? ?Outpatient Meds: ?Current Outpatient Medications  ?Medication Sig Dispense Refill  ? Multiple Vitamin (MULTIVITAMIN) tablet Take 1 tablet by mouth as needed.    ? ?No current facility-administered medications for this visit.  ? ? ? ? ?___________________________________________________________________ ?Objective  ? ?Exam: ? ?BP 124/90 (BP Location: Left Arm, Patient Position: Sitting, Cuff Size: Normal)   Pulse 69   Ht 5' 8.25" (1.734 m) Comment: measured without shoes  Wt 171 lb 6 oz (77.7 kg)   BMI 25.87 kg/m?  ?Wt Readings from Last 3 Encounters:  ?11/02/21 171 lb 6 oz (77.7 kg)  ?09/21/21 171 lb 6.4 oz (77.7 kg)  ?04/03/18 170 lb (77.1 kg)  ? ? ?General: Well-appearing ?Eyes: sclera anicteric, no redness ?ENT: oral mucosa moist without lesions, no cervical or supraclavicular  lymphadenopathy ?CV: RRR without murmur, S1/S2, no JVD, no peripheral edema ?Resp: clear to auscultation bilaterally, normal RR and effort noted ?GI: soft, no tenderness, with active bowel sounds. No guarding or palpable organomegaly noted. ?Skin; warm and dry, no rash or jaundice noted ?Neuro: awake, alert and oriented x 3. Normal gross motor function and fluent speech ?Rectal: Normal perianal area.  (Quite tense on exam) increase resting sphincter tone, no tenderness or fissure or palpable internal lesion. ?Anoscopy: Swollen internal hemorrhoidal plexus. ?Labs: ? ? ?  Latest Ref Rng & Units 09/21/2021  ? 10:19 AM 12/26/2017  ?  5:59 PM 05/08/2010  ? 10:30 AM  ?CBC  ?WBC 4.0 - 10.5 K/uL 5.9   5.6   4.3    ?Hemoglobin 13.0 - 17.0 g/dL 13.5   14.9   13.5    ?Hematocrit 39.0 - 52.0 % 40.3   45.1   39.8    ?Platelets 150.0 - 400.0 K/uL 197.0    183    ? ? ?Assessment: ?Encounter Diagnosis  ?Name Primary?  ?  Bleeding internal hemorrhoids Yes  ?Aaditya does not have inflammatory bowel disease based on his last evaluation here. ? ?He has symptomatic internal hemorrhoids with bleeding and what sounds like occasional prolapse.  His bowel habits are regular 1 or 2 times a day with occasional straining.  He works third shift and so his bowel habits can sometimes be irregular. ? ?I discussed the anatomy and nature of internal hemorrhoids, and showed diagrams as well.  He recalls having hemorrhoids years ago and used hemorrhoid suppository but the symptoms are now worse and more frequent.  So I recommended definitive therapy with hemorrhoidal banding.  Procedure described in detail along with diagrams of the anatomy and he was apprehensive, concerned that he would not be sedated for the procedure. ?We gave him a brochure, I asked him to consider it further and let us know if he would like to proceed.  Meanwhile, he can use Preparation H suppository as needed when bleeding or swelling occurs. ? ? ?Thank you for the courtesy of this  consult.  Please call me with any questions or concerns. ? ?Nelida Meuse III ? ?CC: Referring provider noted above ? ?

## 2021-12-05 ENCOUNTER — Telehealth: Payer: Self-pay | Admitting: Gastroenterology

## 2021-12-05 NOTE — Telephone Encounter (Signed)
Hemorrhoidal banding is over 90% effective for hemorrhoids and will typically take 3 sessions separated by few weeks each for most patients.  He should be scheduled with me at my next availability since I already know this patient.  If he is not certain he wishes to proceed or would like another opinion about it, he can also be referred to Dr. Dema Severin of colorectal surgery at [facility].  HD

## 2021-12-05 NOTE — Telephone Encounter (Signed)
Attempted to reach patient twice. His phone goes straight to vm. Lm on vm for patient to return call. 

## 2021-12-05 NOTE — Telephone Encounter (Signed)
Patient called requesting to schedule a hemorrhoid banding. Per patient, was discussed during his OV on 11/02/21. It appears that Dr. Loletha Carrow doesn't have any availability, can we schedule with another provider? Patient also states having questions regarding that procedure. Would like to know what the success rate is. Please advise.

## 2021-12-06 NOTE — Telephone Encounter (Signed)
Called and spoke with patient. I informed him of information provided by Dr. Loletha Carrow. Patient wants to proceed with hemorrhoid banding. Pt has been scheduled for his 1st hemorrhoid banding on Friday, 01/04/22 at 10:20 am. Pt also wanted to be put on the schedule for a 2nd hem banding just in case. Pt is aware that there is no guarantee that he may need a 2nd hem banding. His 2nd hem band has been scheduled for Monday, 01/21/22 at 1:20 pm. I answered all of patient's questions regarding the hem banding. Pt verbalized understanding and had no concerns at the end of the call.

## 2022-01-04 ENCOUNTER — Encounter: Payer: Self-pay | Admitting: Gastroenterology

## 2022-01-04 ENCOUNTER — Ambulatory Visit (INDEPENDENT_AMBULATORY_CARE_PROVIDER_SITE_OTHER): Payer: 59 | Admitting: Gastroenterology

## 2022-01-04 VITALS — BP 100/64 | HR 68 | Ht 68.25 in | Wt 172.4 lb

## 2022-01-04 DIAGNOSIS — K648 Other hemorrhoids: Secondary | ICD-10-CM | POA: Diagnosis not present

## 2022-01-04 NOTE — Progress Notes (Signed)
      GI Progress Note  Chief Complaint: Bleeding internal hemorrhoids  Subjective  History:  Dustin Chan was here today for his planned first hemorrhoidal banding. He continues to have intermittent bleeding with bowel movements. In addition, he has been very anxious about this appointment and barely slept last night out of anxiety over it. No pain with bowel movements, no abdominal pain.  I again reviewed diagrams of the anatomy and explained hemorrhoidal banding procedure in detail.  I also offered surgical consultation if he was not certain he wished to proceed.  After that thorough discussion along with risks and benefits he signed consent and requested hemorrhoidal banding.  ROS: Cardiovascular:  no chest pain Respiratory: no dyspnea  The patient's Past Medical, Family and Social History were reviewed and are on file in the EMR.  Objective:  Med list reviewed  Current Outpatient Medications:    Multiple Vitamin (MULTIVITAMIN) tablet, Take 1 tablet by mouth as needed., Disp: , Rfl:    Vital signs in last 24 hrs: Vitals:   01/04/22 1040  BP: 100/64  Pulse: 68   Wt Readings from Last 3 Encounters:  01/04/22 172 lb 6 oz (78.2 kg)  11/02/21 171 lb 6 oz (77.7 kg)  09/21/21 171 lb 6.4 oz (77.7 kg)    Physical Exam  Abdomen soft nondistended nontender  Perianal exam normal. Nitroglycerin ointment applied to the external anal opening.  No palpable fissure on DRE. As before, he was quite tense on DRE, arching his back and placing his hands against the wall.  He was clear that it was not because of tenderness on exam but rather anxiety. Relaxation and breathing techniques were not helpful.   With assistance from CMA helping to relax him and to retract the right buttock, I passed the banding device to the anal verge.  This was challenging due to increased anal sphincter tone. I positioned the banding device with the intention of banding the RP hemorrhoidal column  and then suctioned hemorrhoidal tissue.  He reported pain but could not characterize it further.  I released the hemorrhoidal tissue and repositioned the band but he continued to have pain. I did not suction any further tissue nor deployed the band, and remove the device terminating the procedure.  _____________________________________________ Assessment & Plan  Assessment: Encounter Diagnosis  Name Primary?   Bleeding internal hemorrhoids Yes   He is intolerant of this procedure, thus I did not perform hemorrhoidal banding.  I recommended that Dustin Chan have a consultation with colorectal surgery and consider a more definitive surgical procedure. Referral sent to Dr. Marin Olp at [facility].  20 minutes were spent on this encounter (including chart review, history/exam, counseling/coordination of care, and documentation) > 50% of that time was spent on counseling and coordination of care.   Charlie Pitter III

## 2022-01-14 ENCOUNTER — Telehealth: Payer: Self-pay

## 2022-01-14 NOTE — Telephone Encounter (Signed)
Records sent to [facility] Dr Dema Severin. Will await appointment info.

## 2022-01-21 ENCOUNTER — Encounter: Payer: 59 | Admitting: Gastroenterology

## 2022-01-25 NOTE — Telephone Encounter (Signed)
Per [facility] they cannot reach the patient to schedule. No answer and mailbox is full.  I tried to call also and mailbox is still full. Letter mailed to patients home address on file.

## 2022-02-21 DIAGNOSIS — K641 Second degree hemorrhoids: Secondary | ICD-10-CM | POA: Insufficient documentation

## 2023-01-13 ENCOUNTER — Ambulatory Visit (INDEPENDENT_AMBULATORY_CARE_PROVIDER_SITE_OTHER): Payer: 59 | Admitting: Family Medicine

## 2023-01-13 ENCOUNTER — Encounter: Payer: Self-pay | Admitting: Family Medicine

## 2023-01-13 VITALS — BP 122/78 | HR 69 | Temp 97.6°F | Ht 68.25 in | Wt 169.4 lb

## 2023-01-13 DIAGNOSIS — S29011A Strain of muscle and tendon of front wall of thorax, initial encounter: Secondary | ICD-10-CM | POA: Diagnosis not present

## 2023-01-13 DIAGNOSIS — Z0001 Encounter for general adult medical examination with abnormal findings: Secondary | ICD-10-CM | POA: Diagnosis not present

## 2023-01-13 DIAGNOSIS — Z8547 Personal history of malignant neoplasm of testis: Secondary | ICD-10-CM

## 2023-01-13 DIAGNOSIS — Z0000 Encounter for general adult medical examination without abnormal findings: Secondary | ICD-10-CM

## 2023-01-13 NOTE — Progress Notes (Signed)
[facility] [address] [address] [place] [address] Dept: [phone] Dept Fax: [phone]  Annual Physical Visit  Subjective:    Patient ID: Dustin Chan, male    DOB: 02-Aug-1970, 52 y.o..   MRN: [id number]  Chief Complaint  Patient presents with   Annual Exam    CPE/labs.  C/o having tenderness in the mid abdomen.    History of Present Illness:  Patient is in today for an annual physical/preventative visit.  Review of Systems  Constitutional:  Negative for chills, diaphoresis, fever, malaise/fatigue and weight loss.  HENT:  Negative for congestion, ear pain, hearing loss, sinus pain, sore throat and tinnitus.   Eyes:  Negative for blurred vision, pain, discharge and redness.  Respiratory:  Negative for cough, shortness of breath and wheezing.   Cardiovascular:  Negative for chest pain and palpitations.       Has had chest wall pain over the past 3 weeks. he had lifted a tire around the time this started. he thought this might be the cause. He finds pressure over the lower chest wall reproduces the pain.  Gastrointestinal:  Negative for abdominal pain, constipation, diarrhea, heartburn, nausea and vomiting.  Genitourinary:        History of prior testicular cancer, s/p left orchiectomy.  Musculoskeletal:  Negative for back pain, joint pain and myalgias.  Skin:  Negative for itching and rash.  Psychiatric/Behavioral:  Negative for depression. The patient is not nervous/anxious.    Past Medical History: Patient Active Problem List   Diagnosis Date Noted   Grade II internal hemorrhoids 02/21/2022   Ulcerative colitis (HCC) 09/21/2021   GERD (gastroesophageal reflux disease) 09/21/2021   History of testicular cancer 09/21/2021   History of DVT (deep vein thrombosis), provoked 09/21/2021   Hiatal hernia 04/05/2010   Gout 02/02/2010   Marijuana abuse, continuous 01/12/2010   Rectal bleeding 01/12/2010   Past Surgical  History:  Procedure Laterality Date   COLONOSCOPY     TESTICLE REMOVAL Left    Family History  Problem Relation Age of Onset   Breast cancer Mother    COPD Father    Prostate cancer Father    Heart disease Maternal Grandmother    Heart disease Maternal Grandfather    Outpatient Medications Prior to Visit  Medication Sig Dispense Refill   Multiple Vitamin (MULTIVITAMIN) tablet Take 1 tablet by mouth as needed.     No facility-administered medications prior to visit.   No Known Allergies Objective:   Today's Vitals   01/13/23 1527  BP: 122/78  Pulse: 69  Temp: 97.6 F (36.4 C)  TempSrc: Temporal  SpO2: 99%  Weight: 169 lb 6.4 oz (76.8 kg)  Height: 5' 8.25" (1.734 m)   Body mass index is 25.57 kg/m.   General: Well developed, well nourished. No acute distress. HEENT: Normocephalic, non-traumatic. PERRL, EOMI. Conjunctiva clear. External ears normal.   EAC and TMs normal bilaterally. Nose clear without congestion or rhinorrhea. Mucous   membranes moist. Oropharynx clear. Good dentition. Neck: Supple. No lymphadenopathy. No thyromegaly. Lungs: Clear to auscultation bilaterally. No wheezing, rales or rhonchi. CV: RRR without murmurs or rubs. Pulses 2+ bilaterally. Abdomen: Soft, non-tender. Bowel sounds positive, normal pitch and frequency. No   hepatosplenomegaly. No rebound or guarding. Back: Straight. No CVA tenderness bilaterally. Extremities: Full ROM. No joint swelling or tenderness. No edema noted. Skin: Warm and dry. No rashes. GU: Absence of left testicle. Right testicle is normal size and consistency. No masses. No  inguinal   lymph nodes. Psych: Alert and oriented. Normal mood and affect.  Health Maintenance Due  Topic Date Due   Zoster Vaccines- Shingrix (1 of 2) Never done     Assessment & Plan:   Problem List Items Addressed This Visit       Other   History of testicular cancer    I will refer Mr. Hohlt to urology for advice on monitoring s/p  testicular cancer.      Relevant Orders   Ambulatory referral to Urology   Other Visit Diagnoses     Annual physical exam    -  Primary   Overall excellent health. UTD on screenigns and immunizations.   Muscle strain of chest wall, initial encounter       Recommend expectant management.       Return in about 1 year (around 01/13/2024) for Annual preventative care.   Loyola Mast, MD

## 2023-01-13 NOTE — Assessment & Plan Note (Signed)
I will refer [name] to urology for advice on monitoring s/p testicular cancer.

## 2023-02-19 ENCOUNTER — Encounter: Payer: Self-pay | Admitting: Urology

## 2023-02-19 ENCOUNTER — Ambulatory Visit (INDEPENDENT_AMBULATORY_CARE_PROVIDER_SITE_OTHER): Payer: 59 | Admitting: Urology

## 2023-02-19 VITALS — BP 133/83 | HR 56 | Ht 69.0 in | Wt 165.0 lb

## 2023-02-19 DIAGNOSIS — Z8547 Personal history of malignant neoplasm of testis: Secondary | ICD-10-CM | POA: Diagnosis not present

## 2023-02-19 DIAGNOSIS — Z125 Encounter for screening for malignant neoplasm of prostate: Secondary | ICD-10-CM

## 2023-02-19 NOTE — Progress Notes (Signed)
Assessment: 1. History of testicular cancer   2. Prostate cancer screening     Plan: I reviewed the patient records from [facility] as well as [facility].  Imaging, lab, and pathology results reviewed. 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  He did not receive any additional treatment with radiation therapy, chemotherapy, or RPLND.  He has not been followed appropriately using a surveillance protocol.  Fortunately, he is currently asymptomatic.  Given the timeframe, I think it is fairly unlikely that he has had any recurrence. Tumor markers today:  LDH, AFP, HCG Screening PSA Will call with results Return to office prn  Chief Complaint:  Chief Complaint  Patient presents with   testicular cancer    History of Present Illness:  Dustin Chan is a 52 y.o. male who is seen in consultation from Loyola Mast, MD for evaluation of history of testicular cancer. He underwent a left radical orchiectomy on 05/14/2010 by Dr. Retta Diones for a left testicular mass.  Pathology showed classic seminoma, 2.5 cm, with negative margins.  There was evidence of lymphovascular invasion.  This was staged as pT2.  He was evaluated with a CT of the abdomen and pelvis which showed retroperitoneal low-density lesions suspicious for possible metastatic disease, primarily on the right side.  Further evaluation with a PET scan on 06/29/2010 showed no evidence of uptake in the retroperitoneum.  He subsequently underwent a fine-needle aspiration of one of the low density retroperitoneal nodules.  This was benign.  Follow-up CT abdomen and pelvis from 10/04/2010 showed stable cystic masses in the retroperitoneum. Tumor markers from 1/12 were normal. He did not receive any additional treatment for the seminoma. He has not followed up since that time.  He is not having any abdominal pain.  No weight loss or bone pain.  No urinary symptoms.    Past Medical History:  Past  Medical History:  Diagnosis Date   Allergy    Anxiety    Deep vein blood clot of right lower extremity (HCC)    Substance abuse (HCC)    marijuana   Testicular cancer (HCC)     Past Surgical History:  Past Surgical History:  Procedure Laterality Date   COLONOSCOPY     TESTICLE REMOVAL Left     Allergies:  No Known Allergies  Family History:  Family History  Problem Relation Age of Onset   Breast cancer Mother    COPD Father    Prostate cancer Father    Heart disease Maternal Grandmother    Heart disease Maternal Grandfather     Social History:  Social History   Tobacco Use   Smoking status: Former    Current packs/day: 0.00    Types: Cigarettes    Quit date: 2011    Years since quitting: 13.6   Smokeless tobacco: Never  Vaping Use   Vaping status: Never Used  Substance Use Topics   Alcohol use: Yes    Comment: socially 2 beers on the weekend   Drug use: Yes    Frequency: 42.0 times per week    Types: Marijuana    Review of symptoms:  Constitutional:  Negative for unexplained weight loss, night sweats, fever, chills ENT:  Negative for nose bleeds, sinus pain, painful swallowing CV:  Negative for chest pain, shortness of breath, exercise intolerance, palpitations, loss of consciousness Resp:  Negative for cough, wheezing, shortness of breath GI:  Negative for nausea, vomiting, diarrhea, bloody stools GU:  Positives noted in HPI; otherwise negative for gross hematuria, dysuria, urinary incontinence Neuro:  Negative for seizures, poor balance, limb weakness, slurred speech Psych:  Negative for lack of energy, depression, anxiety Endocrine:  Negative for polydipsia, polyuria, symptoms of hypoglycemia (dizziness, hunger, sweating) Hematologic:  Negative for anemia, purpura, petechia, prolonged or excessive bleeding, use of anticoagulants  Allergic:  Negative for difficulty breathing or choking as a result of exposure to anything; no shellfish allergy; no  allergic response (rash/itch) to materials, foods  Physical exam: BP 133/83   Pulse (!) 56   Ht 5\' 9"  (1.753 m)   Wt 165 lb (74.8 kg)   BMI 24.37 kg/m  GENERAL APPEARANCE:  Well appearing, well developed, well nourished, NAD HEENT: Atraumatic, Normocephalic, oropharynx clear. NECK: Supple without lymphadenopathy or thyromegaly. LUNGS: Clear to auscultation bilaterally. HEART: Regular Rate and Rhythm without murmurs, gallops, or rubs. ABDOMEN: Soft, non-tender, No Masses. EXTREMITIES: Moves all extremities well.  Without clubbing, cyanosis, or edema. NEUROLOGIC:  Alert and oriented x 3, normal gait, CN II-XII grossly intact.  MENTAL STATUS:  Appropriate. BACK:  Non-tender to palpation.  No CVAT SKIN:  Warm, dry and intact.   GU: Penis:  uncircumcised Meatus: Normal Scrotum: normal, no masses Testis: surgically absent on left, right normal, without mass or tenderness   Results: None

## 2023-03-31 ENCOUNTER — Telehealth: Payer: Self-pay | Admitting: Urology

## 2023-03-31 NOTE — Telephone Encounter (Signed)
Pt returning [place] call regarding results.

## 2023-03-31 NOTE — Telephone Encounter (Signed)
Attempted to call patient and got VM. No DPR for our office os on file, therefore results could not be left. As this was the 3rd attempt to reach patient by phone, his results were mailed to his home address with the comments highlighted.

## 2023-04-01 ENCOUNTER — Other Ambulatory Visit: Payer: Self-pay | Admitting: Urology

## 2023-04-01 DIAGNOSIS — Z8547 Personal history of malignant neoplasm of testis: Secondary | ICD-10-CM

## 2023-04-04 ENCOUNTER — Encounter (HOSPITAL_BASED_OUTPATIENT_CLINIC_OR_DEPARTMENT_OTHER): Payer: Self-pay

## 2023-04-04 ENCOUNTER — Ambulatory Visit (HOSPITAL_BASED_OUTPATIENT_CLINIC_OR_DEPARTMENT_OTHER)
Admission: RE | Admit: 2023-04-04 | Discharge: 2023-04-04 | Disposition: A | Discharge status: Home / Self Care | Payer: 59 | Source: Ambulatory Visit | Attending: Urology | Admitting: Urology

## 2023-04-04 DIAGNOSIS — Z8547 Personal history of malignant neoplasm of testis: Secondary | ICD-10-CM | POA: Insufficient documentation

## 2023-04-04 MED ORDER — IOHEXOL 300 MG/ML  SOLN
80.0000 mL | Freq: Once | INTRAMUSCULAR | Status: AC | PRN
  Administered 2023-04-04: 80 mL via INTRAVENOUS

## 2023-04-21 ENCOUNTER — Telehealth: Payer: Self-pay

## 2023-04-21 NOTE — Telephone Encounter (Signed)
-----   Message from Di Kindle sent at 04/21/2023  2:10 PM EDT ----- Please notify the patient that his CT and CXR look good.   He may return as needed.

## 2023-04-21 NOTE — Telephone Encounter (Signed)
LMOM for pt to return call. 

## 2023-04-22 ENCOUNTER — Telehealth: Payer: Self-pay | Admitting: Urology

## 2023-04-22 NOTE — Telephone Encounter (Signed)
I saw your note. Just wanted you to know he returned your call.

## 2023-04-29 ENCOUNTER — Telehealth: Payer: Self-pay | Admitting: Urology

## 2023-04-29 NOTE — Telephone Encounter (Signed)
Pt said he was returning your call again regarding results.

## 2023-05-02 NOTE — Telephone Encounter (Signed)
Pt aware of results 

## 2024-02-06 ENCOUNTER — Other Ambulatory Visit: Payer: Self-pay

## 2024-02-06 ENCOUNTER — Emergency Department (HOSPITAL_COMMUNITY)
Admission: EM | Admit: 2024-02-06 | Discharge: 2024-02-06 | Disposition: A | Discharge status: Home / Self Care | Attending: Emergency Medicine | Admitting: Emergency Medicine

## 2024-02-06 DIAGNOSIS — Z8547 Personal history of malignant neoplasm of testis: Secondary | ICD-10-CM | POA: Insufficient documentation

## 2024-02-06 DIAGNOSIS — E876 Hypokalemia: Secondary | ICD-10-CM | POA: Diagnosis not present

## 2024-02-06 DIAGNOSIS — K623 Rectal prolapse: Secondary | ICD-10-CM | POA: Diagnosis not present

## 2024-02-06 DIAGNOSIS — K6289 Other specified diseases of anus and rectum: Secondary | ICD-10-CM | POA: Diagnosis present

## 2024-02-06 LAB — CBC WITH DIFFERENTIAL/PLATELET
Abs Immature Granulocytes: 0.01 K/uL (ref 0.00–0.07)
Basophils Absolute: 0 K/uL (ref 0.0–0.1)
Basophils Relative: 0 %
Eosinophils Absolute: 0.1 K/uL (ref 0.0–0.5)
Eosinophils Relative: 2 %
HCT: 39.7 % (ref 39.0–52.0)
Hemoglobin: 13.5 g/dL (ref 13.0–17.0)
Immature Granulocytes: 0 %
Lymphocytes Relative: 17 %
Lymphs Abs: 1 K/uL (ref 0.7–4.0)
MCH: 28.5 pg (ref 26.0–34.0)
MCHC: 34 g/dL (ref 30.0–36.0)
MCV: 83.9 fL (ref 80.0–100.0)
Monocytes Absolute: 0.5 K/uL (ref 0.1–1.0)
Monocytes Relative: 10 %
Neutro Abs: 4 K/uL (ref 1.7–7.7)
Neutrophils Relative %: 71 %
Platelets: 199 K/uL (ref 150–400)
RBC: 4.73 MIL/uL (ref 4.22–5.81)
RDW: 13.8 % (ref 11.5–15.5)
WBC: 5.6 K/uL (ref 4.0–10.5)
nRBC: 0 % (ref 0.0–0.2)

## 2024-02-06 LAB — COMPREHENSIVE METABOLIC PANEL WITH GFR
ALT: 15 U/L (ref 0–44)
AST: 23 U/L (ref 15–41)
Albumin: 3.7 g/dL (ref 3.5–5.0)
Alkaline Phosphatase: 50 U/L (ref 38–126)
Anion gap: 10 (ref 5–15)
BUN: 14 mg/dL (ref 6–20)
CO2: 23 mmol/L (ref 22–32)
Calcium: 9 mg/dL (ref 8.9–10.3)
Chloride: 105 mmol/L (ref 98–111)
Creatinine, Ser: 1.29 mg/dL — ABNORMAL HIGH (ref 0.61–1.24)
GFR, Estimated: >60 mL/min (ref >60)
Glucose, Bld: 89 mg/dL (ref 70–99)
Potassium: 3.4 mmol/L — ABNORMAL LOW (ref 3.5–5.1)
Sodium: 138 mmol/L (ref 135–145)
Total Bilirubin: 0.7 mg/dL (ref 0.0–1.2)
Total Protein: 6.8 g/dL (ref 6.5–8.1)

## 2024-02-06 MED ORDER — LORAZEPAM 2 MG/ML IJ SOLN
1.0000 mg | Freq: Once | INTRAMUSCULAR | Status: DC
Start: 2024-02-06 — End: 2024-02-06
  Filled 2024-02-06: qty 1

## 2024-02-06 MED ORDER — HYDROMORPHONE HCL 1 MG/ML IJ SOLN
1.0000 mg | Freq: Once | INTRAMUSCULAR | Status: AC
  Administered 2024-02-06: 1 mg via INTRAVENOUS
  Filled 2024-02-06: qty 1

## 2024-02-06 NOTE — ED Triage Notes (Signed)
 Pt. Stated, I have hemroids and they are back and with a lot of blood coming out , the pain started this morning. The pain is so bad. I had surgery last October.

## 2024-02-06 NOTE — ED Notes (Signed)
 Patient refuses to wear bp cuff. SPO2 being monitored

## 2024-02-06 NOTE — Discharge Instructions (Addendum)
 As we discussed, your work-up in the ER was reassuring for acute findings.  Laboratory evaluation did not reveal any emergent concerns.  Your symptoms were due to a prolapse of your rectum. We have resolved this in the ER today. Moving forward it is extremely important that you take a stool softener such as MiraLAX to help reduce straining with bowel movements.  It is also important that you limit the heavy lifting you do.  Straining with bowel movements and heavy lifting are too common reasons that this could recur.  Additionally, I have given you a referral to a gastroenterologist and a surgeon to for management of this.   If this does recur, you could try getting a moist cloth and attempting to push it back in on your own at home. If you are unable to it is very important you come to the ER for management.  Return if develop any new or worsening symptoms.

## 2024-02-06 NOTE — ED Provider Notes (Signed)
 Procedures  Rectal prolapse present Exam performed Manual reduction performed Good reduction of prolapse noted    Levander Houston, MD 02/06/24 1140

## 2024-02-06 NOTE — ED Provider Notes (Signed)
 [place] EMERGENCY DEPARTMENT AT [facility] Provider Note   CSN: [id number] Arrival date & time: 02/06/24  9175     Patient presents with: Rectal Pain   Dustin Chan is a 53 y.o. male.   Patient with history of hemorrhoids presents today with complaints of rectal pain. Reports pain began this morning. Feels like a hemorrhoid, however pain is more severe and reports that he is having bleeding and discharge from his rectum. Denies any history of rectal prolapse previously.   The history is provided by the patient. No language interpreter was used.       Prior to Admission medications   Medication Sig Start Date End Date Taking? Authorizing Provider  Multiple Vitamin (MULTIVITAMIN) tablet Take 1 tablet by mouth as needed.    [provider]    Allergies: Patient has no known allergies.    Review of Systems  Gastrointestinal:  Positive for rectal pain.  All other systems reviewed and are negative.   Updated Vital Signs BP (!) 160/94   Pulse (!) 56   Temp 98.7 F (37.1 C) (Oral)   Resp 16   Ht 5' 9 (1.753 m)   Wt 74.8 kg   SpO2 100%   BMI 24.37 kg/m   Physical Exam Vitals and nursing note reviewed.  Constitutional:      General: He is not in acute distress.    Appearance: Normal appearance. He is normal weight. He is not ill-appearing, toxic-appearing or diaphoretic.  HENT:     Head: Normocephalic and atraumatic.  Cardiovascular:     Rate and Rhythm: Normal rate.  Pulmonary:     Effort: Pulmonary effort is normal. No respiratory distress.  Genitourinary:    Comments: Obvious rectal prolapse. See images below for further Musculoskeletal:        General: Normal range of motion.     Cervical back: Normal range of motion.  Skin:    General: Skin is warm and dry.  Neurological:     General: No focal deficit present.     Mental Status: He is alert.  Psychiatric:        Mood and Affect: Mood normal.        Behavior: Behavior normal.      (all labs ordered are listed, but only abnormal results are displayed) Labs Reviewed  COMPREHENSIVE METABOLIC PANEL WITH GFR - Abnormal; Notable for the following components:      Result Value   Potassium 3.4 (*)    Creatinine, Ser 1.29 (*)    All other components within normal limits  CBC WITH DIFFERENTIAL/PLATELET    EKG: None  Radiology: No results found.   Procedures   Medications Ordered in the ED  LORazepam (ATIVAN) injection 1 mg (has no administration in time range)  HYDROmorphone (DILAUDID) injection 1 mg (1 mg Intravenous Given 02/06/24 0920)                                    Medical Decision Making Amount and/or Complexity of Data Reviewed Labs: ordered.  Risk Prescription drug management.   This patient is a 53 y.o. male who presents to the ED for concern of rectal pain, this involves an extensive number of treatment options, and is a complaint that carries with it a high risk of complications and morbidity. The emergent differential diagnosis prior to evaluation includes, but is not limited to,  hemorrhoid, neoplasm, rectal  prolapse . This is not an exhaustive differential.   Past Medical History / Co-morbidities / Social History:  has a past medical history of Allergy, Anxiety, Deep vein blood clot of right lower extremity (HCC), Substance abuse (HCC), and Testicular cancer (HCC).  Additional history: Chart reviewed. Pertinent results include: seen by surgery for hemorrhoids 2023  Physical Exam: Physical exam performed. The pertinent findings include: obvious rectal prolapse  Lab Tests: I ordered, and personally interpreted labs.  The pertinent results include:  K 3.4, creatinine 1.29   Medications: I ordered medication including dilaudid, ativan  for pain. Reevaluation of the patient after these medicines showed that the patient improved. I have reviewed the patients home medicines and have made adjustments as needed.  Disposition: After  consideration of the diagnostic results and the patients response to treatment, I feel that emergency department workup does not suggest an emergent condition requiring admission or immediate intervention beyond what has been performed at this time. The plan is: Discharge with close outpatient follow-up and return precautions.  Patient with obvious rectal prolapse, reduced at bedside by my attending Dr. Levander. Please see their note for procedure details. Complete resolution of pain after reduction which was successful. Will give referral to GI and general surgery for management. Recommend pcp follow-up as well. Recommend stool softener, reducing straining with bowel movements as well as lifting precautions. Evaluation and diagnostic testing in the emergency department does not suggest an emergent condition requiring admission or immediate intervention beyond what has been performed at this time.  Plan for discharge with close PCP follow-up.  Patient is understanding and amenable with plan, educated on red flag symptoms that would prompt immediate return.  Patient discharged in stable condition.   This is a shared visit with supervising physician Dr. Levander who has independently evaluated patient & provided guidance in evaluation/management/disposition, in agreement with care   Final diagnoses:  Rectal prolapse    ED Discharge Orders     None     An After Visit Summary was printed and given to the patient.      Tristian Bouska A, PA-C 02/06/24 1148    Levander Houston, MD 02/06/24 1600

## 2024-02-06 NOTE — ED Notes (Signed)
 Patient provided with discharge paperwork. Pt demonstrated understanding of material. All questions, comments, and concerns addressed. Pt ambulated in hallway towards exit with no complications

## 2024-02-10 ENCOUNTER — Telehealth: Payer: Self-pay | Admitting: Gastroenterology

## 2024-02-10 NOTE — Telephone Encounter (Signed)
 Available appointment for 02/18/2024 at 10:20 AM. Appointment time held for patient. Attempted to reach spouse and patient. No answer, left vms for both to return call.

## 2024-02-10 NOTE — Telephone Encounter (Signed)
 Patients wife called stating patient was recently in the emergency room, for rectal prolapse. Patient is currently scheduled for 03/23/24, wife is requesting a call back for sooner options. Please advise.  Thank you.

## 2024-02-11 NOTE — Telephone Encounter (Signed)
 Attempted to call patient's spouse to discuss possible new appointment date and time. The number listed is incorrect. Attempted to call patient to discuss. The phone went directly to vm. VM left to return call.

## 2024-02-12 NOTE — Telephone Encounter (Signed)
 Patient still has appointment scheduled for 03/23/2024.

## 2024-02-12 NOTE — Telephone Encounter (Signed)
 3rd attempt to contact patient to discuss sooner appointment No answer, unable to leave message. Mailbox full.

## 2024-02-18 ENCOUNTER — Ambulatory Visit: Admitting: Gastroenterology

## 2024-03-22 NOTE — Progress Notes (Unsigned)
 Dustin FANIEL [id number] [birth year]   Chief Complaint: Rectal prolapse  Referring Provider: Thedora Garnette HERO, MD Primary GI MD: Dr. Legrand  HPI: Dustin Chan is a 53 y.o. male with past medical history of testicular cancer, DVT, marijuana abuse, GERD, hiatal hernia, ulcerative colitis, internal hemorrhoids who presents today for a complaint of rectal prolapse.    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   Was off therapy for many years and reestablished care in 2019 for recurrence of diarrhea and rectal bleeding. Colonoscopy 02/19/2018 (with the patient having been back on Lialda  by then) only showed mild patchy rectal erythema. Biopsies from the ascending, transverse, sigmoid colon and rectum showed no chronic inflammation or other abnormalities.(Biopsies in August 2011 by Dr. Evalyn showed typical changes of chronic colitis/IBD).  Last seen in office 11/02/2021 by Dr. Legrand at which time he reported episodic, painless rectal bleeding on the toilet paper in the toilet.  On anoscopy found to have internal hemorrhoids.  Determined to not have inflammatory bowel disease based on previous evaluation.  Not advised to restart Lialda . Diagnosed with symptomatic internal hemorrhoids with bleeding and occasional prolapse.  Hemorrhoid banding was recommended.  On presentation for hemorrhoid banding 01/04/2022 patient was intolerant of the procedure, and it was recommended that he have a consult with colorectal surgery to consider a more definitive surgical procedure.  Referred to [facility].  Did end up having hemorrhoid banding procedures done with surgery.  Last visit with them 04/2022.  02/06/2024 patient seen in the ED for complaint of rectal pain with bleeding and discharge from the rectum.  On exam found to have an obvious rectal prolapse (see note for image).  Was given Dilaudid , Ativan  for pain.  Manual reduction performed. Referred to GI and general surgery for  management and recommended to have PCP follow-up as well.  Recommended stool softener, reduced straining with bowel movements, and lifting precautions.  Normal CBC, CMP with mildly low potassium and mildly elevated creatinine, otherwise normal.   Patient states he is doing well.  Thought about canceling appointment today.  States that since reduction of rectal prolapse, he has not had any recurrence of prolapse or pain.  Denies history of rectal prolapse prior to episode in July.  States that he has had intermittent rectal bleeding for a while.  Thinks that previous hemorrhoid banding may have helped somewhat, but continued to have intermittent bleeding following procedure.  Since being seen in the ED he has increased his water intake, has been doing sitz baths, and has increased dietary fiber.  In the last 2 to 3 weeks states that his bowel movements are completely normal and he has not noticed any rectal bleeding.  Denies any abdominal or rectal pain.  Currently having 1-2 bowel movements daily without straining.  States that previously he was straining a lot with bowel movements and forcing bowel movements occasionally even if he did not have a sensation of needing to move his bowels.  Was not drinking enough water.    He has not had an appointment with surgery and is not interested in surgical consult at this time.   Previous GI Procedures/Imaging   CT A/P 04/04/2023 1. No evidence of metastatic disease within the abdomen or pelvis. 2. Symmetric wall thickening of the urinary bladder, correlate with urinalysis to exclude cystitis.  Colonoscopy 02/19/2018 - Patchy mild inflammation was found in the rectum. Biopsied.  - The examination was otherwise normal on direct and retroflexion views. Path: 1.  Surgical [P], ascending - BENIGN COLONIC MUCOSA. - NO SIGNIFICANT INFLAMMATION OR OTHER ABNORMALITIES IDENTIFIED. 2. Surgical [P], transverse - BENIGN COLONIC MUCOSA. - NO SIGNIFICANT  INFLAMMATION OR OTHER ABNORMALITIES IDENTIFIED. 3. Surgical [P], sigmoid - BENIGN COLONIC MUCOSA. - NO SIGNIFICANT INFLAMMATION OR OTHER ABNORMALITIES IDENTIFIED. 4. Surgical [P], rectum - BENIGN COLONIC MUCOSA. - NO SIGNIFICANT INFLAMMATION OR OTHER ABNORMALITIES IDENTIFIED.  Past Medical History:  Diagnosis Date   Allergy    Anxiety    Deep vein blood clot of right lower extremity (HCC)    Substance abuse (HCC)    marijuana   Testicular cancer (HCC)     Past Surgical History:  Procedure Laterality Date   COLONOSCOPY     TESTICLE REMOVAL Left     Current Outpatient Medications  Medication Sig Dispense Refill   Multiple Vitamin (MULTIVITAMIN) tablet Take 1 tablet by mouth as needed.     No current facility-administered medications for this visit.    Allergies as of 03/23/2024   (No Known Allergies)    Family History  Problem Relation Age of Onset   Breast cancer Mother    COPD Father    Prostate cancer Father    Heart disease Maternal Grandmother    Heart disease Maternal Grandfather     Social History   Tobacco Use   Smoking status: Former    Current packs/day: 0.00    Types: Cigarettes    Quit date: 2011    Years since quitting: 14.6   Smokeless tobacco: Never  Vaping Use   Vaping status: Never Used  Substance Use Topics   Alcohol use: Yes    Comment: socially 2 beers on the weekend   Drug use: Yes    Frequency: 42.0 times per week    Types: Marijuana     Review of Systems:    Constitutional: No fever, chills Cardiovascular: No chest pain Respiratory: No SOB  Gastrointestinal: See HPI and otherwise negative   Physical Exam:  Vital signs: BP 124/72   Pulse 70   Ht 5' 9 (1.753 m)   Wt 157 lb (71.2 kg)   BMI 23.18 kg/m   Constitutional: Pleasant male in NAD, alert and cooperative Head:  Normocephalic and atraumatic.  Eyes: No scleral icterus.  Respiratory: Respirations even and unlabored. Lungs clear to auscultation bilaterally.  No  wheezes, crackles, or rhonchi.  Cardiovascular:  Regular rate and rhythm. No murmurs. No peripheral edema. Gastrointestinal:  Soft, nondistended, nontender. No rebound or guarding. Normal bowel sounds. No appreciable masses or hepatomegaly. Rectal: No fissures or external hemorrhoids seen but likely prolapsed internal hemorrhoids. No rectal prolapse. Unable to perform DRE due to patient anxiety/discomfort. Neurologic:  Alert and oriented x4;  grossly normal neurologically.  Skin:   Dry and intact without significant lesions or rashes. Psychiatric: Oriented to person, place and time. Demonstrates good judgement and reason without abnormal affect or behaviors.   RELEVANT LABS AND IMAGING: CBC    Component Value Date/Time   WBC 5.6 02/06/2024 0922   RBC 4.73 02/06/2024 0922   HGB 13.5 02/06/2024 0922   HCT 39.7 02/06/2024 0922   PLT 199 02/06/2024 0922   MCV 83.9 02/06/2024 0922   MCV 84.0 12/26/2017 1759   MCH 28.5 02/06/2024 0922   MCHC 34.0 02/06/2024 0922   RDW 13.8 02/06/2024 0922   LYMPHSABS 1.0 02/06/2024 0922   MONOABS 0.5 02/06/2024 0922   EOSABS 0.1 02/06/2024 0922   BASOSABS 0.0 02/06/2024 0922    CMP     Component  Value Date/Time   NA 138 02/06/2024 0922   K 3.4 (L) 02/06/2024 0922   CL 105 02/06/2024 0922   CO2 23 02/06/2024 0922   GLUCOSE 89 02/06/2024 0922   BUN 14 02/06/2024 0922   CREATININE 1.29 (H) 02/06/2024 0922   CALCIUM 9.0 02/06/2024 0922   PROT 6.8 02/06/2024 0922   ALBUMIN 3.7 02/06/2024 0922   AST 23 02/06/2024 0922   ALT 15 02/06/2024 0922   ALKPHOS 50 02/06/2024 0922   BILITOT 0.7 02/06/2024 0922   GFRNONAA >60 02/06/2024 0922   GFRAA  03/15/2010 0635    >60        The eGFR has been calculated using the MDRD equation. This calculation has not been validated in all clinical situations. eGFR's persistently <60 mL/min signify possible Chronic Kidney Disease.     Assessment/Plan:   History of rectal prolapse History of rectal  bleeding Internal hemorrhoids Patient seen today for follow-up after ED visit 02/06/2024 at which time he complained of rectal pain and bleeding and was found to have an obvious rectal prolapse (see ED note for image).  He was given pain control and manual reduction was performed successfully.  Since then he has been avoiding straining with bowel movements, has increased dietary fiber and water intake, and is having 1-2 bowel movements daily.  Denies recurrence of rectal pain or prolapse. He has history of intermittent rectal bleeding and at previously had hemorrhoid banding.  On exam today does appear to have mildly prolapsed hemorrhoids without rectal prolapse, though exam was limited due to patient anxiety and discomfort. States that in the last 2 weeks he is having very regular bowel movements and has not noticed any rectal bleeding. No anemia on recent labs. He has not had follow-up with surgery.  I did advise that he have consult with them but he is not interested at this time. Last colonoscopy 02/2018 with patchy mild inflammation found in the rectum but biopsies benign.  - Advised continued high-fiber diet, increased water intake, and avoidance of straining with bowel movements. - Advised patient that he would benefit from surgical consult for further evaluation and management of rectal prolapse.  He is agreeable to this if he has recurrence. - Will discuss with Dr. Legrand regarding whether repeat colonoscopy indicated at this time.   Camie Furbish, PA-C [facility] 03/23/2024, 8:36 AM  Patient Care Team: Thedora Garnette HERO, MD as PCP - General (Family Medicine) Legrand Victory LITTIE MOULD, MD as Consulting Physician (Gastroenterology)

## 2024-03-23 ENCOUNTER — Encounter: Payer: Self-pay | Admitting: Gastroenterology

## 2024-03-23 ENCOUNTER — Ambulatory Visit: Admitting: Gastroenterology

## 2024-03-23 VITALS — BP 124/72 | HR 70 | Ht 69.0 in | Wt 157.0 lb

## 2024-03-23 DIAGNOSIS — K625 Hemorrhage of anus and rectum: Secondary | ICD-10-CM

## 2024-03-23 DIAGNOSIS — K623 Rectal prolapse: Secondary | ICD-10-CM

## 2024-03-23 DIAGNOSIS — K648 Other hemorrhoids: Secondary | ICD-10-CM

## 2024-03-23 NOTE — Patient Instructions (Addendum)
 Continue High Fiber Diet Maintain good water intake AVOID straining  Call if new or worsening symptoms   Follow up as needed   _______________________________________________________  If your blood pressure at your visit was 140/90 or greater, please contact your primary care physician to follow up on this.  _______________________________________________________  If you are age 51 or older, your body mass index should be between 23-30. Your Body mass index is 23.18 kg/m. If this is out of the aforementioned range listed, please consider follow up with your Primary Care Provider.  If you are age 56 or younger, your body mass index should be between 19-25. Your Body mass index is 23.18 kg/m. If this is out of the aformentioned range listed, please consider follow up with your Primary Care Provider.   ________________________________________________________  [facility] providers would like to encourage you to use MYCHART to communicate with providers for non-urgent requests or questions.  Due to long hold times on the telephone, sending your provider a message by [facility] may be a faster and more efficient way to get a response.  Please allow 48 business hours for a response.  Please remember that this is for non-urgent requests.  _______________________________________________________  [facility] is using a team-based approach to care.  Your team is made up of your doctor and two to three APPS. Our APPS (Nurse Practitioners and Physician Assistants) work with your physician to ensure care continuity for you. They are fully qualified to address your health concerns and develop a treatment plan. They communicate directly with your gastroenterologist to care for you. Seeing the Advanced Practice Practitioners on your physician's team can help you by facilitating care more promptly, often allowing for earlier appointments, access to diagnostic testing, procedures, and other  specialty referrals.    I appreciate the  opportunity to care for you  Thank You   Camie Heinz,PA-C

## 2024-03-24 NOTE — Progress Notes (Signed)
 ____________________________________________________________  Attending physician addendum:  Thank you for sending this case to me. I have reviewed the entire note and agree with the plan.  My opinion is that he does not need another colonoscopy at this point, but that he should see colorectal surgery due to the rectal prolapse.  I understand he is not interested at this time, but perhaps he will reconsider.  Victory Brand, MD  ____________________________________________________________

## 2024-03-29 ENCOUNTER — Telehealth: Payer: Self-pay

## 2024-03-29 NOTE — Telephone Encounter (Signed)
-----   Message from Camie FORBES Furbish sent at 03/28/2024  3:55 PM EDT ----- Please let patient know that Dr. Legrand does not think he needs a colonoscopy but has advised consult with surgery regarding rectal prolapse. If he would like to see them and does not already have a referral we can send one. Thank you ----- Message ----- From: Legrand Victory LITTIE DOUGLAS, MD Sent: 03/24/2024   6:26 AM EDT To: Camie FORBES Furbish, PA-C

## 2024-03-29 NOTE — Telephone Encounter (Signed)
 Left message on machine to call back

## 2024-03-30 NOTE — Telephone Encounter (Signed)
 No answer and voice mail full   Have been unable to reach pt by phone letter mailed
